# Patient Record
Sex: Female | Born: 1985 | Race: White | Hispanic: No | Marital: Single | Smoking: Never smoker
Health system: Southern US, Community
[De-identification: ages and names within clinical notes are randomized; demographics above are authoritative.]

## PROBLEM LIST (undated history)

## (undated) DIAGNOSIS — E0590 Thyrotoxicosis, unspecified without thyrotoxic crisis or storm: Secondary | ICD-10-CM

## (undated) DIAGNOSIS — R768 Other specified abnormal immunological findings in serum: Secondary | ICD-10-CM

## (undated) DIAGNOSIS — Z87898 Personal history of other specified conditions: Secondary | ICD-10-CM

## (undated) HISTORY — DX: Thyrotoxicosis, unspecified without thyrotoxic crisis or storm: E05.90

## (undated) HISTORY — DX: Other specified abnormal immunological findings in serum: R76.8

## (undated) HISTORY — DX: Personal history of other specified conditions: Z87.898

---

## 2004-03-16 DIAGNOSIS — Z87898 Personal history of other specified conditions: Secondary | ICD-10-CM

## 2004-03-16 HISTORY — PX: CERVICAL BIOPSY  W/ LOOP ELECTRODE EXCISION: SUR135

## 2004-03-16 HISTORY — PX: COLPOSCOPY: SHX161

## 2004-03-16 HISTORY — DX: Personal history of other specified conditions: Z87.898

## 2010-04-26 ENCOUNTER — Inpatient Hospital Stay (INDEPENDENT_AMBULATORY_CARE_PROVIDER_SITE_OTHER)
Admission: RE | Admit: 2010-04-26 | Discharge: 2010-04-26 | Disposition: A | Discharge status: Home / Self Care | Payer: Self-pay | Source: Ambulatory Visit

## 2010-04-26 DIAGNOSIS — R209 Unspecified disturbances of skin sensation: Secondary | ICD-10-CM

## 2010-04-26 DIAGNOSIS — M25539 Pain in unspecified wrist: Secondary | ICD-10-CM

## 2011-09-30 ENCOUNTER — Encounter (HOSPITAL_COMMUNITY): Payer: Self-pay | Admitting: Emergency Medicine

## 2011-09-30 ENCOUNTER — Emergency Department (HOSPITAL_COMMUNITY)
Admission: EM | Admit: 2011-09-30 | Discharge: 2011-10-01 | Disposition: A | Discharge status: Home / Self Care | Payer: No Typology Code available for payment source | Attending: Emergency Medicine | Admitting: Emergency Medicine

## 2011-09-30 DIAGNOSIS — S5010XA Contusion of unspecified forearm, initial encounter: Secondary | ICD-10-CM

## 2011-09-30 DIAGNOSIS — S39012A Strain of muscle, fascia and tendon of lower back, initial encounter: Secondary | ICD-10-CM

## 2011-09-30 DIAGNOSIS — S139XXA Sprain of joints and ligaments of unspecified parts of neck, initial encounter: Secondary | ICD-10-CM | POA: Insufficient documentation

## 2011-09-30 DIAGNOSIS — S335XXA Sprain of ligaments of lumbar spine, initial encounter: Secondary | ICD-10-CM | POA: Insufficient documentation

## 2011-09-30 DIAGNOSIS — S161XXA Strain of muscle, fascia and tendon at neck level, initial encounter: Secondary | ICD-10-CM

## 2011-09-30 DIAGNOSIS — S5000XA Contusion of unspecified elbow, initial encounter: Secondary | ICD-10-CM | POA: Insufficient documentation

## 2011-09-30 DIAGNOSIS — Y93I9 Activity, other involving external motion: Secondary | ICD-10-CM | POA: Insufficient documentation

## 2011-09-30 DIAGNOSIS — Y998 Other external cause status: Secondary | ICD-10-CM | POA: Insufficient documentation
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
## 2011-10-01 ENCOUNTER — Emergency Department (HOSPITAL_COMMUNITY): Payer: No Typology Code available for payment source

## 2011-10-01 MED ORDER — IBUPROFEN 800 MG PO TABS: 800.0000 mg | ORAL_TABLET | Freq: Three times a day (TID) | ORAL | Status: AC | PRN

## 2011-10-01 MED ORDER — HYDROCODONE-ACETAMINOPHEN 5-325 MG PO TABS: 1.0000 | ORAL_TABLET | Freq: Four times a day (QID) | ORAL | Status: AC | PRN
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
## 2011-10-01 NOTE — ED Provider Notes (Signed)
Medical screening examination/treatment/procedure(s) were performed by non-physician practitioner and as supervising physician I was immediately available for consultation/collaboration.  Olivia Mackie, MD 10/01/11 [phone]

## 2011-10-01 NOTE — ED Notes (Signed)
Patient transported to X-ray 

## 2011-10-01 NOTE — ED Notes (Signed)
Patient returned from X-ray 

## 2012-06-10 ENCOUNTER — Other Ambulatory Visit: Payer: Self-pay | Admitting: Internal Medicine

## 2012-06-10 ENCOUNTER — Ambulatory Visit
Admission: RE | Admit: 2012-06-10 | Discharge: 2012-06-10 | Disposition: A | Discharge status: Home / Self Care | Payer: No Typology Code available for payment source | Source: Ambulatory Visit | Attending: Internal Medicine | Admitting: Internal Medicine

## 2012-06-10 DIAGNOSIS — E049 Nontoxic goiter, unspecified: Secondary | ICD-10-CM

## 2012-06-21 ENCOUNTER — Other Ambulatory Visit: Payer: Self-pay

## 2012-12-16 ENCOUNTER — Ambulatory Visit (INDEPENDENT_AMBULATORY_CARE_PROVIDER_SITE_OTHER): Payer: No Typology Code available for payment source | Admitting: Certified Nurse Midwife

## 2012-12-16 ENCOUNTER — Encounter: Payer: Self-pay | Admitting: Certified Nurse Midwife

## 2012-12-16 VITALS — BP 90/60 | HR 72 | Ht 63.5 in | Wt 97.0 lb

## 2012-12-16 DIAGNOSIS — B3731 Acute candidiasis of vulva and vagina: Secondary | ICD-10-CM

## 2012-12-16 DIAGNOSIS — N760 Acute vaginitis: Secondary | ICD-10-CM

## 2012-12-16 DIAGNOSIS — B373 Candidiasis of vulva and vagina: Secondary | ICD-10-CM

## 2012-12-16 MED ORDER — METRONIDAZOLE 0.75 % VA GEL: 1.0000 | Freq: Every day | VAGINAL | Status: DC

## 2012-12-16 MED ORDER — FLUCONAZOLE 150 MG PO TABS: 150.0000 mg | ORAL_TABLET | Freq: Once | ORAL | Status: DC

## 2012-12-16 NOTE — Progress Notes (Signed)
27 y.o.SingleCaucasian female G0P0000 with a 2 week(s) history of the following: vaginal:burning, discharge described as malodorous and grey and dyspareunia Sexually active: yes Last sexual activity:7days ago. Pt also reports the following associated symptoms: none Patient has not tried over the counter treatment . No partner change since STD screen at [facility] which was negative. No STD concerns.  O: Healthy thin WD Affect: normal, orientation x 3   Exam:  [id number], Urethra, Skene's normal, no lesions noted                Vag:no lesions, discharge: copious, watery, malodorous and grey in color, pH 5.0, wet prep done                Cx:  normal appearance and non tender                Uterus:normal size, anteverted, non-tender, normal shape and consistency                Adnexa: normal adnexa and no mass, fullness, tenderness  Wet Prep shows:positive yeast, positive BV, negative trich   A::bacterial vaginosis Yeast Vaginitis   P:Reviewed findings of BV and yeast. Discussed baking soda sitz bath for comfort. :Symptomatic local care discussed. Oral antifungal see orders. Vaginal antibiotic see orders. Abstinence from intercourse discussed.  Rv prn

## 2012-12-20 NOTE — Progress Notes (Signed)
Note reviewed, agree with plan.  Jaques Mineer, MD  

## 2013-01-26 ENCOUNTER — Ambulatory Visit (INDEPENDENT_AMBULATORY_CARE_PROVIDER_SITE_OTHER): Payer: No Typology Code available for payment source | Admitting: Certified Nurse Midwife

## 2013-01-26 ENCOUNTER — Encounter: Payer: Self-pay | Admitting: Certified Nurse Midwife

## 2013-01-26 VITALS — BP 90/60 | Resp 12 | Ht 63.5 in | Wt 101.0 lb

## 2013-01-26 DIAGNOSIS — B373 Candidiasis of vulva and vagina: Secondary | ICD-10-CM

## 2013-01-26 DIAGNOSIS — B3731 Acute candidiasis of vulva and vagina: Secondary | ICD-10-CM

## 2013-01-26 MED ORDER — FLUCONAZOLE 150 MG PO TABS: 150.0000 mg | ORAL_TABLET | Freq: Once | ORAL | Status: DC

## 2013-01-26 MED ORDER — NYSTATIN-TRIAMCINOLONE 100000-0.1 UNIT/GM-% EX CREA: 1.0000 "application " | TOPICAL_CREAM | Freq: Two times a day (BID) | CUTANEOUS | Status: DC

## 2013-01-26 NOTE — Progress Notes (Signed)
Note reviewed, agree with plan.  Lafawn Lenoir, MD  

## 2013-01-26 NOTE — Progress Notes (Signed)
27 y.o.Single Caucasian female G0P0000 with a 7 day(s) history of the following:discharge described as clear, no odor Sexually active: yes Last sexual activity:3days ago. Pt also reports the following associated symptoms: pain with sexual activity,feels like tearing. Patient has not tried over the counter treatment.Patient did try OTC lubricant which made it worse. Patient treated for insect bite with antibiotics about 2 weeks ago, now feels symptoms started then. No other issues. Contraception: Nuvaring.  O: Healthy WDWN female Affect: normal, orientation x 3   Exam:  [id number]:[id number], Urethra, Skene's normal, increased pink on vulva and also white creamy exudate noted with some scaling at fourchette area down to perineum, no lesions  Wet prep taken                AOZ:HYQMVHQIO: copious and white, pH 4.0, wet prep done                Cx:  normal appearance and non tender                Uterus:normal size, non-tender, normal shape and consistency                Adnexa: normal adnexa and no mass, fullness, tenderness  Wet Prep shows:Yeast vaginitis and vulvuitis   A: Yeast vaginitis and vulvitis  P: Reviewed findings of yeast. Rx Diflucan see order Rx Mycolog cream see order :Symptomatic local care discussed. Aveeno sitz bath prn comfort. Discussed yeast can be passed to partner.  Rv prn

## 2013-04-10 ENCOUNTER — Other Ambulatory Visit: Payer: Self-pay | Admitting: Certified Nurse Midwife

## 2013-06-12 ENCOUNTER — Telehealth: Payer: Self-pay | Admitting: Certified Nurse Midwife

## 2013-06-12 ENCOUNTER — Encounter: Payer: Self-pay | Admitting: Certified Nurse Midwife

## 2013-06-12 ENCOUNTER — Ambulatory Visit (INDEPENDENT_AMBULATORY_CARE_PROVIDER_SITE_OTHER): Payer: No Typology Code available for payment source | Admitting: Certified Nurse Midwife

## 2013-06-12 VITALS — BP 92/60 | HR 64 | Resp 16 | Ht 63.5 in | Wt 101.0 lb

## 2013-06-12 DIAGNOSIS — B373 Candidiasis of vulva and vagina: Secondary | ICD-10-CM

## 2013-06-12 DIAGNOSIS — B3731 Acute candidiasis of vulva and vagina: Secondary | ICD-10-CM

## 2013-06-12 DIAGNOSIS — Z309 Encounter for contraceptive management, unspecified: Secondary | ICD-10-CM

## 2013-06-12 MED ORDER — ETONOGESTREL-ETHINYL ESTRADIOL 0.12-0.015 MG/24HR VA RING: 1.0000 | VAGINAL_RING | Freq: Once | VAGINAL | Status: DC

## 2013-06-12 NOTE — Telephone Encounter (Signed)
Spoke with patient. Patient states she has a yeast infection and would like to come in to see Verner Choleborah S. Leonard CNM. Patient requesting early morning appointment due to work schedule. Appointment made for today at 9:30. Patient agreeable to time and date.  Routing to provider for final review. Patient agreeable to disposition. Will close encounter

## 2013-06-12 NOTE — Progress Notes (Signed)
27 y.o.Single Caucasian female G0P0000 with complaint of vaginal discharge with odor for 3 days. Denies vaginal itching or burning. Describes discharge as white and mildly odorous. No new personal products, or new partner. Denies urinary symptoms or pain. Contraception is withdrawal. Patient ran out of Nuvaring (over due for [facility]). Desires Nuvaring continuance, she does not want pregnancy ever. No other health issues today.  O:Healthy WDWN female Affect: normal orientation x 3    Exam:  [id number]:[id number]:normal, Bartholin's, Urethra, Skene's normal                BJY:NWGNFAOZHVag:discharge: copious, white and odorless, pH 4.0, wet prep done                Cx:  normal appearance and non tender                Uterus:normal size, non-tender, normal shape and consistency                Adnexa: normal adnexa and no mass, fullness, tenderness  Wet Prep shows:positive for yeast   A:Yeast vaginitis Contraception withdrawal, desires Nuvaring again, [facility] due  P: Reviewed findings of yeast. Patient has allergy history with any "zole' product with intense localized reaction. Discussed unable to treat with Diflucan due to no contraceptive use. Patient will use OTC ph balance and daily probiotic and aveeno sitz bath to see if resolves. Patient will advise if no change and can treat once menses occurs.  Rx Nuvaring no refill, see order. Patient to schedule [facility]. Discussed concerns for pregnancy with no condom use and encouraged use.  Rv prn

## 2013-06-12 NOTE — Telephone Encounter (Signed)
Pt says she has a yeast infection. i didn't see any open slots today and pt can't come in the morning.

## 2013-06-12 NOTE — Patient Instructions (Signed)
Candida Infection, Adult A candida infection (also called yeast, fungus and Monilia infection) is an overgrowth of yeast that can occur anywhere on the body. A yeast infection commonly occurs in warm, moist body areas. Usually, the infection remains localized but can spread to become a systemic infection. A yeast infection may be a sign of a more severe disease such as diabetes, leukemia, or AIDS. A yeast infection can occur in both men and women. In women, Candida vaginitis is a vaginal infection. It is one of the most common causes of vaginitis. Men usually do not have symptoms or know they have an infection until other problems develop. Men may find out they have a yeast infection because their sex partner has a yeast infection. Uncircumcised men are more likely to get a yeast infection than circumcised men. This is because the uncircumcised glans is not exposed to air and does not remain as dry as that of a circumcised glans. Older adults may develop yeast infections around dentures. CAUSES  Women  Antibiotics.  Steroid medication taken for a long time.  Being overweight (obese).  Diabetes.  Poor immune condition.  Certain serious medical conditions.  Immune suppressive medications for organ transplant patients.  Chemotherapy.  Pregnancy.  Menstration.  Stress and fatigue.  Intravenous drug use.  Oral contraceptives.  Wearing tight-fitting clothes in the crotch area.  Catching it from a sex partner who has a yeast infection.  Spermicide.  Intravenous, urinary, or other catheters. Men  Catching it from a sex partner who has a yeast infection.  Having oral or anal sex with a person who has the infection.  Spermicide.  Diabetes.  Antibiotics.  Poor immune system.  Medications that suppress the immune system.  Intravenous drug use.  Intravenous, urinary, or other catheters. SYMPTOMS  Women  Thick, white vaginal discharge.  Vaginal itching.  Redness and  swelling in and around the vagina.  Irritation of the lips of the vagina and perineum.  Blisters on the vaginal lips and perineum.  Painful sexual intercourse.  Low blood sugar (hypoglycemia).  Painful urination.  Bladder infections.  Intestinal problems such as constipation, indigestion, bad breath, bloating, increase in gas, diarrhea, or loose stools. Men  Men may develop intestinal problems such as constipation, indigestion, bad breath, bloating, increase in gas, diarrhea, or loose stools.  Dry, cracked skin on the penis with itching or discomfort.  Jock itch.  Dry, flaky skin.  Athlete's foot.  Hypoglycemia. DIAGNOSIS  Women  A history and an exam are performed.  The discharge may be examined under a microscope.  A culture may be taken of the discharge. Men  A history and an exam are performed.  Any discharge from the penis or areas of cracked skin will be looked at under the microscope and cultured.  Stool samples may be cultured. TREATMENT  Women  Vaginal antifungal suppositories and creams.  Medicated creams to decrease irritation and itching on the outside of the vagina.  Warm compresses to the perineal area to decrease swelling and discomfort.  Oral antifungal medications.  Medicated vaginal suppositories or cream for repeated or recurrent infections.  Wash and dry the irritation areas before applying the cream.  Eating yogurt with lactobacillus may help with prevention and treatment.  Sometimes painting the vagina with gentian violet solution may help if creams and suppositories do not work. Men  Antifungal creams and oral antifungal medications.  Sometimes treatment must continue for 30 days after the symptoms go away to prevent recurrence. HOME CARE   INSTRUCTIONS  Women  Use cotton underwear and avoid tight-fitting clothing.  Avoid colored, scented toilet paper and deodorant tampons or pads.  Do not douche.  Keep your diabetes  under control.  Finish all the prescribed medications.  Keep your skin clean and dry.  Consume milk or yogurt with lactobacillus active culture regularly. If you get frequent yeast infections and think that is what the infection is, there are over-the-counter medications that you can get. If the infection does not show healing in 3 days, talk to your caregiver.  Tell your sex partner you have a yeast infection. Your partner may need treatment also, especially if your infection does not clear up or recurs. Men  Keep your skin clean and dry.  Keep your diabetes under control.  Finish all prescribed medications.  Tell your sex partner that you have a yeast infection so they can be treated if necessary. SEEK MEDICAL CARE IF:   Your symptoms do not clear up or worsen in one week after treatment.  You have an oral temperature above 102 F (38.9 C).  You have trouble swallowing or eating for a prolonged time.  You develop blisters on and around your vagina.  You develop vaginal bleeding and it is not your menstrual period.  You develop abdominal pain.  You develop intestinal problems as mentioned above.  You get weak or lightheaded.  You have painful or increased urination.  You have pain during sexual intercourse. MAKE SURE YOU:   Understand these instructions.  Will watch your condition.  Will get help right away if you are not doing well or get worse. Document Released: 04/09/2004 Document Revised: 05/25/2011 Document Reviewed: 07/22/2009 [employer] Patient Information 2014 [employer].  

## 2013-06-13 NOTE — Progress Notes (Signed)
Reviewed personally.  M. Suzanne Achol Azpeitia, MD.  

## 2013-06-29 ENCOUNTER — Encounter: Payer: Self-pay | Admitting: Certified Nurse Midwife

## 2013-06-29 ENCOUNTER — Ambulatory Visit (INDEPENDENT_AMBULATORY_CARE_PROVIDER_SITE_OTHER): Payer: No Typology Code available for payment source | Admitting: Certified Nurse Midwife

## 2013-06-29 VITALS — BP 90/60 | Resp 18 | Ht 63.0 in | Wt 102.0 lb

## 2013-06-29 DIAGNOSIS — Z01419 Encounter for gynecological examination (general) (routine) without abnormal findings: Secondary | ICD-10-CM

## 2013-06-29 DIAGNOSIS — Z0000 Encounter for general adult medical examination without abnormal findings: Secondary | ICD-10-CM

## 2013-06-29 DIAGNOSIS — N898 Other specified noninflammatory disorders of vagina: Secondary | ICD-10-CM

## 2013-06-29 DIAGNOSIS — Z309 Encounter for contraceptive management, unspecified: Secondary | ICD-10-CM

## 2013-06-29 MED ORDER — ETONOGESTREL-ETHINYL ESTRADIOL 0.12-0.015 MG/24HR VA RING: 1.0000 | VAGINAL_RING | Freq: Once | VAGINAL | Status: DC

## 2013-06-29 NOTE — Progress Notes (Signed)
28 y.o. G0P0000 Single Caucasian Fe here for annual exam.  Periods normal, no issues. Contraception working well. Still complaining of increase in vaginal discharge with odor. Now has Nuvaring in so can be treated if needed.Previous no contraception and had not had period. Medication change with Saul FordyceKelly Virgil, PA with discontinuation of Abilify. Patient was also diagnosed last year with acute Hepatitis and hypothyroid which has all resolved per blood work. Partner change, desires STD screening.  No other health issues today. Sees PCP as needed.  Patient's last menstrual period was 06/24/2013.          Sexually active: yes  The current method of family planning is NuvaRing vaginal inserts.    Exercising: no  The patient does not participate in regular exercise at present. Smoker:  no  Health Maintenance: Pap:  04/06/12 Neg TDaP:  2013 Labs:  Declines urine     reports that she has never smoked. She has never used smokeless tobacco. She reports that she drinks alcohol. She reports that she does not use illicit drugs.  Past Medical History  Diagnosis Date  . History of abnormal Pap smear 2006    with LEEP  . Hyperthyroidism     Past Surgical History  Procedure Laterality Date  . Cervical biopsy  w/ loop electrode excision  2006  . Colposcopy  2006    Current Outpatient Prescriptions  Medication Sig Dispense Refill  . amphetamine-dextroamphetamine (ADDERALL XR) 15 MG 24 hr capsule Take 1 capsule by mouth daily.      Marland Kitchen. etonogestrel-ethinyl estradiol (NUVARING) 0.12-0.015 MG/24HR vaginal ring Place 1 each vaginally once. Insert vaginally and leave in place for 3 consecutive weeks, then remove for 1 week.  1 each  0  . gabapentin (NEURONTIN) 300 MG capsule Take 300 mg by mouth daily.      Marland Kitchen. buPROPion (WELLBUTRIN XL) 150 MG 24 hr tablet        No current facility-administered medications for this visit.    History reviewed. No pertinent family history.  ROS:  Pertinent items are noted  in HPI.  Otherwise, a comprehensive ROS was negative.  Exam:   BP 90/60  Resp 18  Ht 5\' 3"  (1.6 m)  Wt 102 lb (46.267 kg)  BMI 18.07 kg/m2  LMP 06/24/2013 Height: 5\' 3"  (160 cm)  Ht Readings from Last 3 Encounters:  06/29/13 5\' 3"  (1.6 m)  06/12/13 5' 3.5" (1.613 m)  01/26/13 5' 3.5" (1.613 m)    General appearance: alert, cooperative and appears stated age Head: Normocephalic, without obvious abnormality, atraumatic Neck: no adenopathy, supple, symmetrical, trachea midline and thyroid normal to inspection and palpation and non-palpable Lungs: clear to auscultation bilaterally Breasts: normal appearance, no masses or tenderness, No nipple retraction or dimpling, No nipple discharge or bleeding, No axillary or supraclavicular adenopathy Heart: regular rate and rhythm Abdomen: soft, non-tender; no masses,  no organomegaly Extremities: extremities normal, atraumatic, no cyanosis or edema Skin: Skin color, texture, turgor normal. No rashes or lesions Lymph nodes: Cervical, supraclavicular, and axillary nodes normal. No abnormal inguinal nodes palpated Neurologic: Grossly normal   Pelvic: External genitalia:  no lesions              Urethra:  normal appearing urethra with no masses, tenderness or lesions              Bartholin's and Skene's: normal                 Vagina: normal appearing vagina with normal color  and discharge, no lesions wet prep taken, ph 3.5              Cervix: normal, non tender              Pap taken: no Bimanual Exam:  Uterus:  normal size, contour, position, consistency, mobility, non-tender and anteverted              Adnexa: normal adnexa and no mass, fullness, tenderness               Rectovaginal: Confirms               Anus:  Deferred  Wet prep: no pathogens  A:  Well Woman with normal exam  Contraception Nuvaring desired  STD screening  P:   Reviewed health and wellness pertinent to exam  Rx Nuvaring see order  Lab: GC,Chlamydia,  HIV,RPR  Discussed negative wet prep and normal vaginal discharge at this point. Discussed increase discharge when puts new Nuvaring in. Patient remembers from before. Reassured.  Pap smear as per guidelines   pap smear not taken today  counseled on breast self exam, STD prevention, HIV risk factors and prevention, adequate intake of calcium and vitamin D, diet and exercise  return annually or prn  An After Visit Summary was printed and given to the patient.

## 2013-06-29 NOTE — Patient Instructions (Signed)
General topics  Next pap or exam is  due in 1 year Take a Women's multivitamin Take 1200 mg. of calcium daily - prefer dietary If any concerns in interim to call back  Breast Self-Awareness Practicing breast self-awareness may pick up problems early, prevent significant medical complications, and possibly save your life. By practicing breast self-awareness, you can become familiar with how your breasts look and feel and if your breasts are changing. This allows you to notice changes early. It can also offer you some reassurance that your breast health is good. One way to learn what is normal for your breasts and whether your breasts are changing is to do a breast self-exam. If you find a lump or something that was not present in the past, it is best to contact your caregiver right away. Other findings that should be evaluated by your caregiver include nipple discharge, especially if it is bloody; skin changes or reddening; areas where the skin seems to be pulled in (retracted); or new lumps and bumps. Breast pain is seldom associated with cancer (malignancy), but should also be evaluated by a caregiver. BREAST SELF-EXAM The best time to examine your breasts is 5 7 days after your menstrual period is over.  ExitCare Patient Information 2013 [employer].   Exercise to Stay Healthy Exercise helps you become and stay healthy. EXERCISE IDEAS AND TIPS Choose exercises that:  You enjoy.  Fit into your day. You do not need to exercise really hard to be healthy. You can do exercises at a slow or medium level and stay healthy. You can:  Stretch before and after working out.  Try yoga, Pilates, or tai chi.  Lift weights.  Walk fast, swim, jog, run, climb stairs, bicycle, dance, or rollerskate.  Take aerobic classes. Exercises that burn about 150 calories:  Running 1  miles in 15 minutes.  Playing volleyball for 45 to 60 minutes.  Washing and waxing a car for 45 to 60  minutes.  Playing touch football for 45 minutes.  Walking 1  miles in 35 minutes.  Pushing a stroller 1  miles in 30 minutes.  Playing basketball for 30 minutes.  Raking leaves for 30 minutes.  Bicycling 5 miles in 30 minutes.  Walking 2 miles in 30 minutes.  Dancing for 30 minutes.  Shoveling snow for 15 minutes.  Swimming laps for 20 minutes.  Walking up stairs for 15 minutes.  Bicycling 4 miles in 15 minutes.  Gardening for 30 to 45 minutes.  Jumping rope for 15 minutes.  Washing windows or floors for 45 to 60 minutes. Document Released: 04/04/2010 Document Revised: 05/25/2011 Document Reviewed: 04/04/2010 ExitCare Patient Information 2013 [employer].   Other topics ( that may be useful information):    Sexually Transmitted Disease Sexually transmitted disease (STD) refers to any infection that is passed from person to person during sexual activity. This may happen by way of saliva, semen, blood, vaginal mucus, or urine. Common STDs include:  Gonorrhea.  Chlamydia.  Syphilis.  HIV/AIDS.  Genital herpes.  Hepatitis B and C.  Trichomonas.  Human papillomavirus (HPV).  Pubic lice. CAUSES  An STD may be spread by bacteria, virus, or parasite. A person can get an STD by:  Sexual intercourse with an infected person.  Sharing sex toys with an infected person.  Sharing needles with an infected person.  Having intimate contact with the genitals, mouth, or rectal areas of an infected person. SYMPTOMS  Some people may not have any symptoms, but   they can still pass the infection to others. Different STDs have different symptoms. Symptoms include:  Painful or bloody urination.  Pain in the pelvis, abdomen, vagina, anus, throat, or eyes.  Skin rash, itching, irritation, growths, or sores (lesions). These usually occur in the genital or anal area.  Abnormal vaginal discharge.  Penile discharge in men.  Soft, flesh-colored skin growths in the  genital or anal area.  Fever.  Pain or bleeding during sexual intercourse.  Swollen glands in the groin area.  Yellow skin and eyes (jaundice). This is seen with hepatitis. DIAGNOSIS  To make a diagnosis, your caregiver may:  Take a medical history.  Perform a physical exam.  Take a specimen (culture) to be examined.  Examine a sample of discharge under a microscope.  Perform blood test TREATMENT   Chlamydia, gonorrhea, trichomonas, and syphilis can be cured with antibiotic medicine.  Genital herpes, hepatitis, and HIV can be treated, but not cured, with prescribed medicines. The medicines will lessen the symptoms.  Genital warts from HPV can be treated with medicine or by freezing, burning (electrocautery), or surgery. Warts may come back.  HPV is a virus and cannot be cured with medicine or surgery.However, abnormal areas may be followed very closely by your caregiver and may be removed from the cervix, vagina, or vulva through office procedures or surgery. If your diagnosis is confirmed, your recent sexual partners need treatment. This is true even if they are symptom-free or have a negative culture or evaluation. They should not have sex until their caregiver says it is okay. HOME CARE INSTRUCTIONS  All sexual partners should be informed, tested, and treated for all STDs.  Take your antibiotics as directed. Finish them even if you start to feel better.  Only take over-the-counter or prescription medicines for pain, discomfort, or fever as directed by your caregiver.  Rest.  Eat a balanced diet and drink enough fluids to keep your urine clear or pale yellow.  Do not have sex until treatment is completed and you have followed up with your caregiver. STDs should be checked after treatment.  Keep all follow-up appointments, Pap tests, and blood tests as directed by your caregiver.  Only use latex condoms and water-soluble lubricants during sexual activity. Do not use  petroleum jelly or oils.  Avoid alcohol and illegal drugs.  Get vaccinated for HPV and hepatitis. If you have not received these vaccines in the past, talk to your caregiver about whether one or both might be right for you.  Avoid risky sex practices that can break the skin. The only way to avoid getting an STD is to avoid all sexual activity.Latex condoms and dental dams (for oral sex) will help lessen the risk of getting an STD, but will not completely eliminate the risk. SEEK MEDICAL CARE IF:   You have a fever.  You have any new or worsening symptoms. Document Released: 05/23/2002 Document Revised: 05/25/2011 Document Reviewed: 05/30/2010 [facility] Patient Information 2013 [employer].    Domestic Abuse You are being battered or abused if someone close to you hits, pushes, or physically hurts you in any way. You also are being abused if you are forced into activities. You are being sexually abused if you are forced to have sexual contact of any kind. You are being emotionally abused if you are made to feel worthless or if you are constantly threatened. It is important to remember that help is available. No one has the right to abuse you. PREVENTION OF FURTHER  ABUSE  Learn the warning signs of danger. This varies with situations but may include: the use of alcohol, threats, isolation from friends and family, or forced sexual contact. Leave if you feel that violence is going to occur.  If you are attacked or beaten, report it to the police so the abuse is documented. You do not have to press charges. The police can protect you while you or the attackers are leaving. Get the officer's name and badge number and a copy of the report.  Find someone you can trust and tell them what is happening to you: your caregiver, a nurse, clergy member, close friend or family member. Feeling ashamed is natural, but remember that you have done nothing wrong. No one deserves abuse. Document Released:  02/28/2000 Document Revised: 05/25/2011 Document Reviewed: 05/08/2010 ExitCare Patient Information 2013 [employer].    How Much is Too Much Alcohol? Drinking too much alcohol can cause injury, accidents, and health problems. These types of problems can include:   Car crashes.  Falls.  Family fighting (domestic violence).  Drowning.  Fights.  Injuries.  Burns.  Damage to certain organs.  Having a baby with birth defects. ONE DRINK CAN BE TOO MUCH WHEN YOU ARE:  Working.  Pregnant or breastfeeding.  Taking medicines. Ask your doctor.  Driving or planning to drive. If you or someone you know has a drinking problem, get help from a doctor.  Document Released: 12/27/2008 Document Revised: 05/25/2011 Document Reviewed: 12/27/2008 ExitCare Patient Information 2013 [employer].   Smoking Hazards Smoking cigarettes is extremely bad for your health. Tobacco smoke has over 200 known poisons in it. There are over 60 chemicals in tobacco smoke that cause cancer. Some of the chemicals found in cigarette smoke include:   Cyanide.  Benzene.  Formaldehyde.  Methanol (wood alcohol).  Acetylene (fuel used in welding torches).  Ammonia. Cigarette smoke also contains the poisonous gases nitrogen oxide and carbon monoxide.  Cigarette smokers have an increased risk of many serious medical problems and Smoking causes approximately:  90% of all lung cancer deaths in men.  80% of all lung cancer deaths in women.  90% of deaths from chronic obstructive lung disease. Compared with nonsmokers, smoking increases the risk of:  Coronary heart disease by 2 to 4 times.  Stroke by 2 to 4 times.  Men developing lung cancer by 23 times.  Women developing lung cancer by 13 times.  Dying from chronic obstructive lung diseases by 12 times.  . Smoking is the most preventable cause of death and disease in our society.  WHY IS SMOKING ADDICTIVE?  Nicotine is the chemical  agent in tobacco that is capable of causing addiction or dependence.  When you smoke and inhale, nicotine is absorbed rapidly into the bloodstream through your lungs. Nicotine absorbed through the lungs is capable of creating a powerful addiction. Both inhaled and non-inhaled nicotine may be addictive.  Addiction studies of cigarettes and spit tobacco show that addiction to nicotine occurs mainly during the teen years, when young people begin using tobacco products. WHAT ARE THE BENEFITS OF QUITTING?  There are many health benefits to quitting smoking.   Likelihood of developing cancer and heart disease decreases. Health improvements are seen almost immediately.  Blood pressure, pulse rate, and breathing patterns start returning to normal soon after quitting. QUITTING SMOKING   American Lung Association - 1-800-LUNGUSA  American Cancer Society - 1-800-ACS-2345 Document Released: 04/09/2004 Document Revised: 05/25/2011 Document Reviewed: 12/12/2008 ExitCare Patient Information 2013 [employer]   LL[employer].   Stress Management Stress is a state of physical or mental tension that often results from changes in your life or normal routine. Some common causes of stress are:  Death of a loved one.  Injuries or severe illnesses.  Getting fired or changing jobs.  Moving into a new home. Other causes may be:  Sexual problems.  Business or financial losses.  Taking on a large debt.  Regular conflict with someone at home or at work.  Constant tiredness from lack of sleep. It is not just bad things that are stressful. It may be stressful to:  Win the lottery.  Get married.  Buy a new car. The amount of stress that can be easily tolerated varies from person to person. Changes generally cause stress, regardless of the types of change. Too much stress can affect your health. It may lead to physical or emotional problems. Too little stress (boredom) may also become stressful. SUGGESTIONS TO  REDUCE STRESS:  Talk things over with your family and friends. It often is helpful to share your concerns and worries. If you feel your problem is serious, you may want to get help from a professional counselor.  Consider your problems one at a time instead of lumping them all together. Trying to take care of everything at once may seem impossible. List all the things you need to do and then start with the most important one. Set a goal to accomplish 2 or 3 things each day. If you expect to do too many in a single day you will naturally fail, causing you to feel even more stressed.  Do not use alcohol or drugs to relieve stress. Although you may feel better for a short time, they do not remove the problems that caused the stress. They can also be habit forming.  Exercise regularly - at least 3 times per week. Physical exercise can help to relieve that "uptight" feeling and will relax you.  The shortest distance between despair and hope is often a good night's sleep.  Go to bed and get up on time allowing yourself time for appointments without being rushed.  Take a short "time-out" period from any stressful situation that occurs during the day. Close your eyes and take some deep breaths. Starting with the muscles in your face, tense them, hold it for a few seconds, then relax. Repeat this with the muscles in your neck, shoulders, hand, stomach, back and legs.  Take good care of yourself. Eat a balanced diet and get plenty of rest.  Schedule time for having fun. Take a break from your daily routine to relax. HOME CARE INSTRUCTIONS   Call if you feel overwhelmed by your problems and feel you can no longer manage them on your own.  Return immediately if you feel like hurting yourself or someone else. Document Released: 08/26/2000 Document Revised: 05/25/2011 Document Reviewed: 04/18/2007 ExitCare Patient Information 2013 [employer].   

## 2013-06-30 LAB — CBC
HCT: 35.8 % — ABNORMAL LOW (ref 36.0–46.0)
Hemoglobin: 12.1 g/dL (ref 12.0–15.0)
MCH: 29.3 pg (ref 26.0–34.0)
MCHC: 33.8 g/dL (ref 30.0–36.0)
MCV: 86.7 fL (ref 78.0–100.0)
PLATELETS: 284 10*3/uL (ref 150–400)
RBC: 4.13 MIL/uL (ref 3.87–5.11)
RDW: 13.2 % (ref 11.5–15.5)
WBC: 4.3 10*3/uL (ref 4.0–10.5)

## 2013-06-30 LAB — HIV ANTIBODY (ROUTINE TESTING W REFLEX): HIV 1&2 Ab, 4th Generation: NONREACTIVE

## 2013-06-30 LAB — RPR

## 2013-07-04 LAB — IPS N GONORRHOEA AND CHLAMYDIA BY PCR

## 2013-07-04 LAB — IPS PAP TEST WITH REFLEX TO HPV

## 2013-07-06 ENCOUNTER — Telehealth: Payer: Self-pay | Admitting: Nurse Practitioner

## 2013-07-06 NOTE — Telephone Encounter (Signed)
Patient canceled her appointment for "yeast inf" 07/07/13. Patient she is feeling much better and doesn't need to keep this appointment.

## 2013-07-07 ENCOUNTER — Ambulatory Visit: Payer: No Typology Code available for payment source | Admitting: Nurse Practitioner

## 2013-07-07 NOTE — Progress Notes (Signed)
Reviewed personally.  M. Suzanne Arlenne Kimbley, MD.  

## 2013-10-02 ENCOUNTER — Encounter (HOSPITAL_COMMUNITY): Payer: Self-pay | Admitting: Emergency Medicine

## 2013-10-02 ENCOUNTER — Emergency Department (HOSPITAL_COMMUNITY): Payer: No Typology Code available for payment source

## 2013-10-02 ENCOUNTER — Emergency Department (HOSPITAL_COMMUNITY)
Admission: EM | Admit: 2013-10-02 | Discharge: 2013-10-02 | Disposition: A | Discharge status: Home / Self Care | Payer: No Typology Code available for payment source | Attending: Emergency Medicine | Admitting: Emergency Medicine

## 2013-10-02 DIAGNOSIS — M545 Low back pain, unspecified: Secondary | ICD-10-CM | POA: Insufficient documentation

## 2013-10-02 DIAGNOSIS — Z008 Encounter for other general examination: Secondary | ICD-10-CM | POA: Diagnosis present

## 2013-10-02 DIAGNOSIS — F911 Conduct disorder, childhood-onset type: Secondary | ICD-10-CM | POA: Insufficient documentation

## 2013-10-02 DIAGNOSIS — Z3202 Encounter for pregnancy test, result negative: Secondary | ICD-10-CM | POA: Insufficient documentation

## 2013-10-02 DIAGNOSIS — F329 Major depressive disorder, single episode, unspecified: Secondary | ICD-10-CM | POA: Diagnosis present

## 2013-10-02 DIAGNOSIS — Z79899 Other long term (current) drug therapy: Secondary | ICD-10-CM | POA: Diagnosis not present

## 2013-10-02 DIAGNOSIS — Z862 Personal history of diseases of the blood and blood-forming organs and certain disorders involving the immune mechanism: Secondary | ICD-10-CM | POA: Insufficient documentation

## 2013-10-02 DIAGNOSIS — Z8639 Personal history of other endocrine, nutritional and metabolic disease: Secondary | ICD-10-CM | POA: Insufficient documentation

## 2013-10-02 DIAGNOSIS — F32A Depression, unspecified: Secondary | ICD-10-CM | POA: Diagnosis present

## 2013-10-02 DIAGNOSIS — F3289 Other specified depressive episodes: Secondary | ICD-10-CM

## 2013-10-02 DIAGNOSIS — M25569 Pain in unspecified knee: Secondary | ICD-10-CM | POA: Diagnosis not present

## 2013-10-02 DIAGNOSIS — R454 Irritability and anger: Secondary | ICD-10-CM

## 2013-10-02 DIAGNOSIS — F39 Unspecified mood [affective] disorder: Secondary | ICD-10-CM

## 2013-10-02 DIAGNOSIS — F3481 Disruptive mood dysregulation disorder: Secondary | ICD-10-CM | POA: Diagnosis present

## 2013-10-02 LAB — BASIC METABOLIC PANEL
Anion gap: 11 (ref 5–15)
BUN: 9 mg/dL (ref 6–23)
CO2: 25 mEq/L (ref 19–32)
Calcium: 8.8 mg/dL (ref 8.4–10.5)
Chloride: 105 mEq/L (ref 96–112)
Creatinine, Ser: 0.65 mg/dL (ref 0.50–1.10)
Glucose, Bld: 107 mg/dL — ABNORMAL HIGH (ref 70–99)
POTASSIUM: 3.6 meq/L — AB (ref 3.7–5.3)
SODIUM: 141 meq/L (ref 137–147)

## 2013-10-02 LAB — CBC WITH DIFFERENTIAL/PLATELET
BASOS PCT: 1 % (ref 0–1)
Basophils Absolute: 0 10*3/uL (ref 0.0–0.1)
EOS ABS: 0 10*3/uL (ref 0.0–0.7)
Eosinophils Relative: 1 % (ref 0–5)
HCT: 34.9 % — ABNORMAL LOW (ref 36.0–46.0)
Hemoglobin: 11.8 g/dL — ABNORMAL LOW (ref 12.0–15.0)
Lymphocytes Relative: 49 % — ABNORMAL HIGH (ref 12–46)
Lymphs Abs: 2.1 10*3/uL (ref 0.7–4.0)
MCH: 30.3 pg (ref 26.0–34.0)
MCHC: 33.8 g/dL (ref 30.0–36.0)
MCV: 89.5 fL (ref 78.0–100.0)
MONOS PCT: 9 % (ref 3–12)
Monocytes Absolute: 0.4 10*3/uL (ref 0.1–1.0)
NEUTROS PCT: 40 % — AB (ref 43–77)
Neutro Abs: 1.7 10*3/uL (ref 1.7–7.7)
PLATELETS: 270 10*3/uL (ref 150–400)
RBC: 3.9 MIL/uL (ref 3.87–5.11)
RDW: 12.5 % (ref 11.5–15.5)
WBC: 4.3 10*3/uL (ref 4.0–10.5)

## 2013-10-02 LAB — RAPID URINE DRUG SCREEN, HOSP PERFORMED
Amphetamines: NOT DETECTED
BARBITURATES: NOT DETECTED
Benzodiazepines: NOT DETECTED
COCAINE: NOT DETECTED
Opiates: NOT DETECTED
Tetrahydrocannabinol: NOT DETECTED

## 2013-10-02 LAB — ETHANOL: Alcohol, Ethyl (B): <11 mg/dL (ref 0–11)

## 2013-10-02 LAB — POC URINE PREG, ED: Preg Test, Ur: NEGATIVE
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
## 2013-10-02 NOTE — ED Notes (Signed)
Patient has a black dress ,black flip flops, and underwear and jewelry.  Gave cell phone and wallet to her mother.

## 2013-10-08 NOTE — Consult Note (Signed)
Case discussed, agree with plan 

## 2013-10-11 ENCOUNTER — Ambulatory Visit (INDEPENDENT_AMBULATORY_CARE_PROVIDER_SITE_OTHER): Payer: No Typology Code available for payment source | Admitting: Certified Nurse Midwife

## 2013-10-11 ENCOUNTER — Encounter: Payer: Self-pay | Admitting: Certified Nurse Midwife

## 2013-10-11 VITALS — BP 96/62 | HR 60 | Resp 16 | Ht 63.0 in | Wt 94.0 lb

## 2013-10-11 DIAGNOSIS — N760 Acute vaginitis: Secondary | ICD-10-CM

## 2013-10-11 DIAGNOSIS — Z202 Contact with and (suspected) exposure to infections with a predominantly sexual mode of transmission: Secondary | ICD-10-CM

## 2013-10-11 MED ORDER — METRONIDAZOLE 500 MG PO TABS: 500.0000 mg | ORAL_TABLET | Freq: Two times a day (BID) | ORAL | Status: DC

## 2013-10-11 NOTE — Progress Notes (Signed)
27 y.o.single white female g0p0 here with complaint of vaginal symptoms of itching, burning, and increase discharge. Describes discharge as watery pink with odor. Onset of symptoms 7 days ago. Denies new personal products.  STD concerns and would like testing. Partner of 3 years broke trust and concerned about possible exposure.  Urinary symptoms none .   O:Healthy female [id number] Affect: normal, orientation x 3  Exam: Abdomen:non tender Lymph node: no enlargement or tenderness Pelvic exam: External genital: normal, no lesions BUS: negative Vagina: watery yellow grey odorous discharge noted. Ph:5.5   ,Wet prep taken Nuvaring in vagina Cervix: normal, non tender Uterus: normal, non tender Adnexa:normal, non tender, no masses or fullness noted  Wet Prep results: positive for clue  A:BV STD screening   P:Discussed findings of BV and etiology. Discussed Aveeno or baking soda sitz bath for comfort. Avoid moist clothes or pads for extended period of time. If working out in gym clothes or swim suits for long periods of time change underwear or bottoms of swimsuit if possible. Rx: Flagyl see order Discussed consistent condom use for protection and re screen in 3 months and do HIV,RPR, Hep C also. Lab GC, Chlamydia  Rv prn

## 2013-10-11 NOTE — Patient Instructions (Signed)
Bacterial Vaginosis Bacterial vaginosis is a vaginal infection that occurs when the normal balance of bacteria in the vagina is disrupted. It results from an overgrowth of certain bacteria. This is the most common vaginal infection in women of childbearing age. Treatment is important to prevent complications, especially in pregnant women, as it can cause a premature delivery. CAUSES  Bacterial vaginosis is caused by an increase in harmful bacteria that are normally present in smaller amounts in the vagina. Several different kinds of bacteria can cause bacterial vaginosis. However, the reason that the condition develops is not fully understood. RISK FACTORS Certain activities or behaviors can put you at an increased risk of developing bacterial vaginosis, including:  Having a new sex partner or multiple sex partners.  Douching.  Using an intrauterine device (IUD) for contraception. Women do not get bacterial vaginosis from toilet seats, bedding, swimming pools, or contact with objects around them. SIGNS AND SYMPTOMS  Some women with bacterial vaginosis have no signs or symptoms. Common symptoms include:  Grey vaginal discharge.  A fishlike odor with discharge, especially after sexual intercourse.  Itching or burning of the vagina and vulva.  Burning or pain with urination. DIAGNOSIS  Your health care provider will take a medical history and examine the vagina for signs of bacterial vaginosis. A sample of vaginal fluid may be taken. Your health care provider will look at this sample under a microscope to check for bacteria and abnormal cells. A vaginal pH test may also be done.  TREATMENT  Bacterial vaginosis may be treated with antibiotic medicines. These may be given in the form of a pill or a vaginal cream. A second round of antibiotics may be prescribed if the condition comes back after treatment.  HOME CARE INSTRUCTIONS   Only take over-the-counter or prescription medicines as  directed by your health care provider.  If antibiotic medicine was prescribed, take it as directed. Make sure you finish it even if you start to feel better.  Do not have sex until treatment is completed.  Tell all sexual partners that you have a vaginal infection. They should see their health care provider and be treated if they have problems, such as a mild rash or itching.  Practice safe sex by using condoms and only having one sex partner. SEEK MEDICAL CARE IF:   Your symptoms are not improving after 3 days of treatment.  You have increased discharge or pain.  You have a fever. MAKE SURE YOU:   Understand these instructions.  Will watch your condition.  Will get help right away if you are not doing well or get worse. FOR MORE INFORMATION  Centers for Disease Control and Prevention, Division of STD Prevention: www.cdc.gov/std American Sexual Health Association (ASHA): www.ashastd.org  Document Released: 03/02/2005 Document Revised: 12/21/2012 Document Reviewed: 10/12/2012 ExitCare Patient Information 2015 [employer]. This information is not intended to replace advice given to you by your health care provider. Make sure you discuss any questions you have with your health care provider.  

## 2013-10-12 NOTE — Progress Notes (Signed)
Reviewed personally.  M. Suzanne Meya Clutter, MD.  

## 2013-10-13 LAB — IPS N GONORRHOEA AND CHLAMYDIA BY PCR

## 2013-10-13 NOTE — ED Provider Notes (Signed)
Medical screening examination/treatment/procedure(s) were conducted as a shared visit with non-physician practitioner(s) and myself.  I personally evaluated the patient during the encounter.   EKG Interpretation None       Doug SouSam Olie Dibert, MD 10/13/13 1113

## 2013-12-05 ENCOUNTER — Ambulatory Visit (INDEPENDENT_AMBULATORY_CARE_PROVIDER_SITE_OTHER): Payer: No Typology Code available for payment source | Admitting: Certified Nurse Midwife

## 2013-12-05 ENCOUNTER — Ambulatory Visit: Payer: No Typology Code available for payment source | Admitting: Certified Nurse Midwife

## 2013-12-05 ENCOUNTER — Encounter: Payer: Self-pay | Admitting: Certified Nurse Midwife

## 2013-12-05 VITALS — BP 110/70 | HR 68 | Resp 16 | Ht 63.0 in | Wt 95.0 lb

## 2013-12-05 DIAGNOSIS — Z202 Contact with and (suspected) exposure to infections with a predominantly sexual mode of transmission: Secondary | ICD-10-CM

## 2013-12-05 DIAGNOSIS — N760 Acute vaginitis: Secondary | ICD-10-CM

## 2013-12-05 MED ORDER — METRONIDAZOLE 500 MG PO TABS: 500.0000 mg | ORAL_TABLET | Freq: Two times a day (BID) | ORAL | Status: DC

## 2013-12-05 NOTE — Patient Instructions (Signed)
Bacterial Vaginosis Bacterial vaginosis is a vaginal infection that occurs when the normal balance of bacteria in the vagina is disrupted. It results from an overgrowth of certain bacteria. This is the most common vaginal infection in women of childbearing age. Treatment is important to prevent complications, especially in pregnant women, as it can cause a premature delivery. CAUSES  Bacterial vaginosis is caused by an increase in harmful bacteria that are normally present in smaller amounts in the vagina. Several different kinds of bacteria can cause bacterial vaginosis. However, the reason that the condition develops is not fully understood. RISK FACTORS Certain activities or behaviors can put you at an increased risk of developing bacterial vaginosis, including:  Having a new sex partner or multiple sex partners.  Douching.  Using an intrauterine device (IUD) for contraception. Women do not get bacterial vaginosis from toilet seats, bedding, swimming pools, or contact with objects around them. SIGNS AND SYMPTOMS  Some women with bacterial vaginosis have no signs or symptoms. Common symptoms include:  Grey vaginal discharge.  A fishlike odor with discharge, especially after sexual intercourse.  Itching or burning of the vagina and vulva.  Burning or pain with urination. DIAGNOSIS  Your health care provider will take a medical history and examine the vagina for signs of bacterial vaginosis. A sample of vaginal fluid may be taken. Your health care provider will look at this sample under a microscope to check for bacteria and abnormal cells. A vaginal pH test may also be done.  TREATMENT  Bacterial vaginosis may be treated with antibiotic medicines. These may be given in the form of a pill or a vaginal cream. A second round of antibiotics may be prescribed if the condition comes back after treatment.  HOME CARE INSTRUCTIONS   Only take over-the-counter or prescription medicines as  directed by your health care provider.  If antibiotic medicine was prescribed, take it as directed. Make sure you finish it even if you start to feel better.  Do not have sex until treatment is completed.  Tell all sexual partners that you have a vaginal infection. They should see their health care provider and be treated if they have problems, such as a mild rash or itching.  Practice safe sex by using condoms and only having one sex partner. SEEK MEDICAL CARE IF:   Your symptoms are not improving after 3 days of treatment.  You have increased discharge or pain.  You have a fever. MAKE SURE YOU:   Understand these instructions.  Will watch your condition.  Will get help right away if you are not doing well or get worse. FOR MORE INFORMATION  Centers for Disease Control and Prevention, Division of STD Prevention: www.cdc.gov/std American Sexual Health Association (ASHA): www.ashastd.org  Document Released: 03/02/2005 Document Revised: 12/21/2012 Document Reviewed: 10/12/2012 ExitCare Patient Information 2015 [employer]. This information is not intended to replace advice given to you by your health care provider. Make sure you discuss any questions you have with your health care provider.  
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
## 2013-12-06 LAB — IPS N GONORRHOEA AND CHLAMYDIA BY PCR

## 2013-12-06 NOTE — Progress Notes (Signed)
Reviewed personally.  M. Suzanne Sarrinah Gardin, MD.  

## 2013-12-18 ENCOUNTER — Telehealth: Payer: Self-pay | Admitting: Certified Nurse Midwife

## 2013-12-18 NOTE — Telephone Encounter (Signed)
Pt has been bleeding for 12 days now. Says that this isnt really her cycle she had her cycle around 11/18/13 it is time for her to take nuva ring out and start her cycle but she still hasnt stopped bleeding from the second bleed. Pt says it is almost black not red.

## 2013-12-18 NOTE — Telephone Encounter (Signed)
Spoke with patient. Advised spoke with Verner Choleborah S. Leonard CNM and she recommends that patient take nuvaring out today as this is technically her week for her cycle and come in tomorrow to further discuss everything. Patient is agreeable. Appointment scheduled for tomorrow at 2pm with Verner Choleborah S. Leonard CNM. Agreeable to date and time.  Routing to provider for final review. Patient agreeable to disposition. Will close encounter

## 2013-12-18 NOTE — Telephone Encounter (Signed)
Spoke with patient at time of incoming call. Patient started her LMP on 9/7. Placed her ring again on 9/10 and her cycle started again on 9/22. Patient states that this cycle was "heavy and a different color than usual." Patient describes the color as "almost black it was so dark red." Patient was been wearing pads until 10/2. States that bleeding is now more "spotting" but is still dark in color. Denies any abdominal pain. Patient is supposed to take her ring out today to start her cycle. "That is a lot of bleeding. I don't know what to do. I have had a lot of infections this year so I wanted to let Debbi know." Appointment scheduled for tomorrow 10/6 at 2:30pm with Verner Choleborah S. Leonard CNM. Patient is agreeable to date and time. Advised patient would speak with Verner Choleborah S. Leonard CNM about what she should do about removing and replacing her ring and give her a call back. Patient is agreeable.

## 2013-12-19 ENCOUNTER — Ambulatory Visit (INDEPENDENT_AMBULATORY_CARE_PROVIDER_SITE_OTHER): Payer: No Typology Code available for payment source | Admitting: Certified Nurse Midwife

## 2013-12-19 ENCOUNTER — Encounter: Payer: Self-pay | Admitting: Certified Nurse Midwife

## 2013-12-19 VITALS — BP 110/66 | HR 76 | Resp 16 | Ht 63.0 in | Wt 95.0 lb

## 2013-12-19 DIAGNOSIS — Z3040 Encounter for surveillance of contraceptives, unspecified: Secondary | ICD-10-CM

## 2013-12-19 NOTE — Patient Instructions (Addendum)

## 2013-12-19 NOTE — Progress Notes (Signed)
28 y.o. Single Caucasian [id number] for evaluation of Nuvaring, she has used for the past year, but due to recent stress and illness, has been" off track with when to remove and insert. Not sexually active for past month. Has ended relationship with partner of 4 years. Feeling much better now after strep infection resolved. Patient inserted new Nuvaring on 11-23-13, Plans to remove in another week, but having bleeding now. Concerned. Does not want pelvic exam unless needed. Also plans to enroll in [employer], but needs to gain 13 pounds to do so. No other health issues today.   O: Healthy female, WD WN Affect: normal orientation X 3    A: History of viral illness, followed by Strep throat, social stress with partner Unsure of Nuvaring status with removal/reinsertion  P:Discussed improving immune system with probiotic and limiting exposure to new stress with any relationship at this point. Discussed good diet with extra calories with Ensure or Protein smoothie, no sugars. Given information on good diet. Sat down with patient with calendar and discussed she should  Have taken Nuvaring out on 12-12-13. The bleeding has been her period, New Nuvaring needs to be inserted 12/21/13. Reviewed with patient the next 3 months when to insert and remove Nuvaring. Patient felt much better about and knows she can continue on time now, used for one year with issues. Questions addressed.   35 minutes spent with patient  in face to face counseling.  RV prn

## 2013-12-20 NOTE — Progress Notes (Signed)
Reviewed personally.  M. Suzanne Phylliss Strege, MD.  

## 2014-01-11 ENCOUNTER — Ambulatory Visit: Payer: No Typology Code available for payment source | Admitting: Certified Nurse Midwife

## 2014-01-18 ENCOUNTER — Telehealth: Payer: Self-pay | Admitting: Certified Nurse Midwife

## 2014-01-18 ENCOUNTER — Encounter: Payer: Self-pay | Admitting: Certified Nurse Midwife

## 2014-01-18 ENCOUNTER — Ambulatory Visit: Payer: No Typology Code available for payment source | Admitting: Certified Nurse Midwife

## 2014-01-18 NOTE — Telephone Encounter (Addendum)
Pt called at 10:28 to cancel 10:30 appointment. She got upset because she was notified that she will be charged a dnka fee for today's visit and didn't wish to reschedule.

## 2014-01-18 NOTE — Telephone Encounter (Signed)
Per shannon who spoke with patient, she said she had a lot going on at home.

## 2014-01-18 NOTE — Telephone Encounter (Signed)
Did she give a reason for cancelling?

## 2014-01-19 ENCOUNTER — Ambulatory Visit: Payer: No Typology Code available for payment source | Admitting: Certified Nurse Midwife

## 2014-02-05 NOTE — Telephone Encounter (Signed)
Left message to call Kaitlyn at [phone]. 

## 2014-02-05 NOTE — Telephone Encounter (Signed)
Pt says she is having bleeding after her period that is very heavy.

## 2014-02-06 ENCOUNTER — Ambulatory Visit (INDEPENDENT_AMBULATORY_CARE_PROVIDER_SITE_OTHER): Payer: No Typology Code available for payment source | Admitting: Certified Nurse Midwife

## 2014-02-06 ENCOUNTER — Encounter: Payer: Self-pay | Admitting: Certified Nurse Midwife

## 2014-02-06 VITALS — BP 106/76 | HR 68 | Resp 16 | Ht 63.0 in | Wt 98.0 lb

## 2014-02-06 DIAGNOSIS — N926 Irregular menstruation, unspecified: Secondary | ICD-10-CM

## 2014-02-06 DIAGNOSIS — Z113 Encounter for screening for infections with a predominantly sexual mode of transmission: Secondary | ICD-10-CM

## 2014-02-06 DIAGNOSIS — D509 Iron deficiency anemia, unspecified: Secondary | ICD-10-CM

## 2014-02-06 DIAGNOSIS — Z3049 Encounter for surveillance of other contraceptives: Secondary | ICD-10-CM

## 2014-02-06 LAB — HEMOGLOBIN, FINGERSTICK: Hemoglobin, fingerstick: 9.6 g/dL — ABNORMAL LOW (ref 12.0–16.0)

## 2014-02-06 LAB — POCT URINE PREGNANCY: Preg Test, Ur: NEGATIVE

## 2014-02-06 MED ORDER — INTEGRA PLUS PO CAPS: 1.0000 | ORAL_CAPSULE | Freq: Two times a day (BID) | ORAL | Status: DC

## 2014-02-06 MED ORDER — ETONOGESTREL-ETHINYL ESTRADIOL 0.12-0.015 MG/24HR VA RING: VAGINAL_RING | VAGINAL | Status: DC

## 2014-02-06 NOTE — Telephone Encounter (Signed)
Spoke with patient. Patient states that she is experiencing dark brown/black bleeding that is heavy. "This happened to me last month and I came in to see Debbi. The same thing is happening again." Patient states that she cannot remember when the start of her LMP was. "It was around the beginning of November."  Patient is currently using Nuvaring for contraception. Is due to remove ring on 11/26. Is having to change pad/tampon every 2 hours. Denies abdominal pain, nausea, vaginal odor. States that she has been more fatigued over this past month.Advised patient will need to be seen for evaluation with Verner Choleborah S. Leonard CNM. Patient is agreeable and has today off from work. Appointment scheduled for today at 10:30am. Patient is agreeable to date and time.  Routing to provider for final review. Patient agreeable to disposition. Will close encounter

## 2014-02-06 NOTE — Progress Notes (Signed)
28 y.o. white female g0p0 here with complaint of vaginal BTB with Nuvaring again. Patient has been consistent with Nuvaring use and had normal period 01/16/14 which lasted approximately 3-4 days, light minimal cramping. Patient put Nuvaring on time and began dark brown bleeding on day 10 of cycle and has continued to have light bleeding off and on until today, which is very light. Required mini pad only. Denies cramping until today. Due to remove Nuvaring in 2 days. Denies pelvic pain, fever or chills, just some fatigue. She is sexually active again with previous partner who was in jail. Would like GC,Chlamydia screening only.  Happy with Nuvaring and has no problems in past except, when she did not insert on time. No other health issues today.  O:Healthy female WDWN Affect: normal, orientation x 3 POCT Hgb. 9.4 previous at aex 11.4 Exam: Abdomen:non tender, soft Lymph node: no enlargement or tenderness Pelvic exam: External genital: Normal BUS: negative Vagina: scant amount of dark blood noted  noted.   Cervix: normal, non tender, no lesions, blood noted from cervix Uterus: normal, non tender Adnexa:normal, non tender, no masses or fullness noted   A:Normal pelvic exam Contraception Nuvaring with BTB STD screening Anemia   P:Discussed findings of normal pelvic exam and Nuvaring in place. Discussed BTB can occur with any contraception and usually resolves with next cycle. Due to anemia, recommend she go ahead and remove Nuvaring and insert new one to avoid another withdrawal bleed. Patient feels like this is a good idea and will insert new Nuvaring today. Encouraged patient to wear for 3 weeks and replace again with new Nuvaring and then when menses due remove and start back on regular routine to see if this will help her cycle normally again. Voiced understanding. Lab: GC,Chlamydia Discussed need to start on Rx Integra plus to help with anemia. Work on iron foods in diet which will help with  fatigue. Hydrate well daily.  Labs Iron,CBC, Ferritin, IBC Rx Integra Plus see order Patient will call if bleeding continues .  Rv prn Rv prn

## 2014-02-06 NOTE — Patient Instructions (Signed)
Iron Deficiency Anemia Anemia is a condition in which there are less red blood cells or hemoglobin in the blood than normal. Hemoglobin is the part of red blood cells that carries oxygen. Iron deficiency anemia is anemia caused by too little iron. It is the most common type of anemia. It may leave you tired and short of breath. CAUSES   Lack of iron in the diet.  Poor absorption of iron, as seen with intestinal disorders.  Intestinal bleeding.  Heavy periods. SIGNS AND SYMPTOMS  Mild anemia may not be noticeable. Symptoms may include:  Fatigue.  Headache.  Pale skin.  Weakness.  Tiredness.  Shortness of breath.  Dizziness.  Cold hands and feet.  Fast or irregular heartbeat. DIAGNOSIS  Diagnosis requires a thorough evaluation and physical exam by your health care provider. Blood tests are generally used to confirm iron deficiency anemia. Additional tests may be done to find the underlying cause of your anemia. These may include:  Testing for blood in the stool (fecal occult blood test).  A procedure to see inside the colon and rectum (colonoscopy).  A procedure to see inside the esophagus and stomach (endoscopy). TREATMENT  Iron deficiency anemia is treated by correcting the cause of the deficiency. Treatment may involve:  Adding iron-rich foods to your diet.  Taking iron supplements. Pregnant or breastfeeding women need to take extra iron because their normal diet usually does not provide the required amount.  Taking vitamins. Vitamin C improves the absorption of iron. Your health care provider may recommend that you take your iron tablets with a glass of orange juice or vitamin C supplement.  Medicines to make heavy menstrual flow lighter.  Surgery. HOME CARE INSTRUCTIONS   Take iron as directed by your health care provider.  If you cannot tolerate taking iron supplements by mouth, talk to your health care provider about taking them through a vein  (intravenously) or an injection into a muscle.  For the best iron absorption, iron supplements should be taken on an empty stomach. If you cannot tolerate them on an empty stomach, you may need to take them with food.  Do not drink milk or take antacids at the same time as your iron supplements. Milk and antacids may interfere with the absorption of iron.  Iron supplements can cause constipation. Make sure to include fiber in your diet to prevent constipation. A stool softener may also be recommended.  Take vitamins as directed by your health care provider.  Eat a diet rich in iron. Foods high in iron include liver, lean beef, whole-grain bread, eggs, dried fruit, and dark green leafy vegetables. SEEK IMMEDIATE MEDICAL CARE IF:   You faint. If this happens, do not drive. Call your local emergency services (911 in [place] if no other help is available.  You have chest pain.  You feel nauseous or vomit.  You have severe or increased shortness of breath with activity.  You feel weak.  You have a rapid heartbeat.  You have unexplained sweating.  You become light-headed when getting up from a chair or bed. MAKE SURE YOU:   Understand these instructions.  Will watch your condition.  Will get help right away if you are not doing well or get worse. Document Released: 02/28/2000 Document Revised: 03/07/2013 Document Reviewed: 11/07/2012 ExitCare Patient Information 2015 [employer]. This information is not intended to replace advice given to you by your health care provider. Make sure you discuss any questions you have with your health care provider.  

## 2014-02-07 NOTE — Progress Notes (Signed)
Reviewed personally.  M. Suzanne Chanese Hartsough, MD.  

## 2014-02-09 LAB — IPS N GONORRHOEA AND CHLAMYDIA BY PCR

## 2014-02-20 ENCOUNTER — Telehealth: Payer: Self-pay | Admitting: Certified Nurse Midwife

## 2014-02-20 NOTE — Telephone Encounter (Signed)
Continue use until end of cycle if light, should stop. She can also do Motrin 600mg  with food for 24 hours to see it will stop with use of anti inflammatory.

## 2014-02-20 NOTE — Telephone Encounter (Signed)
Spoke with patient. Advised patient of message as seen below from Deborah S. Leonard CNM. Patient is agreeable and verbalizes understanding.   Routing to provider for final review. Patient agreeable to disposition. Will close encounter   

## 2014-02-20 NOTE — Telephone Encounter (Signed)
Pt is calling to follow up with Debbi and see what she must do next

## 2014-02-20 NOTE — Telephone Encounter (Signed)
Spoke with patient. Patient was seen on 11/24 with Verner Choleborah S. Leonard CNM. "Debbi told me to insert a new Nuvaring because I had been bleeding for three weeks. It slowed the bleeding down and then it stopped for one day and started again." Patient states bleeding is light. Replaced Nuvaring on 11/24. Patient would like to know what to do next. Advised patient would speak with Verner Choleborah S. Leonard CNM and return call with further recommendations and instructions. Patient is agreeable.

## 2014-03-13 ENCOUNTER — Other Ambulatory Visit: Payer: No Typology Code available for payment source

## 2014-03-14 ENCOUNTER — Encounter: Payer: Self-pay | Admitting: Certified Nurse Midwife

## 2014-03-14 ENCOUNTER — Other Ambulatory Visit (INDEPENDENT_AMBULATORY_CARE_PROVIDER_SITE_OTHER): Payer: 59

## 2014-03-14 DIAGNOSIS — Z0000 Encounter for general adult medical examination without abnormal findings: Secondary | ICD-10-CM

## 2014-03-14 LAB — CBC
HCT: 39.1 % (ref 36.0–46.0)
HEMOGLOBIN: 13 g/dL (ref 12.0–15.0)
MCH: 30 pg (ref 26.0–34.0)
MCHC: 33.2 g/dL (ref 30.0–36.0)
MCV: 90.3 fL (ref 78.0–100.0)
MPV: 9.8 fL (ref 8.6–12.4)
PLATELETS: 231 10*3/uL (ref 150–400)
RBC: 4.33 MIL/uL (ref 3.87–5.11)
RDW: 12.6 % (ref 11.5–15.5)
WBC: 3.7 10*3/uL — AB (ref 4.0–10.5)

## 2014-03-14 LAB — IBC PANEL
%SAT: 36 % (ref 20–55)
TIBC: 409 ug/dL (ref 250–470)
UIBC: 262 ug/dL (ref 125–400)

## 2014-03-14 LAB — IRON: Iron: 147 ug/dL — ABNORMAL HIGH (ref 42–145)

## 2014-03-15 LAB — FERRITIN: Ferritin: 23 ng/mL (ref 10–291)

## 2014-03-16 ENCOUNTER — Other Ambulatory Visit: Payer: Self-pay | Admitting: Certified Nurse Midwife

## 2014-03-16 DIAGNOSIS — R899 Unspecified abnormal finding in specimens from other organs, systems and tissues: Secondary | ICD-10-CM

## 2014-03-19 ENCOUNTER — Telehealth: Payer: Self-pay

## 2014-03-19 NOTE — Telephone Encounter (Signed)
-----   Message from Verner Chol, CNM sent at 03/16/2014  9:41 AM EST ----- Notify all iron studies look great now CBC normal except for low WBC, needs to work fresh foods in diet and recheck in one month, order in please schedule, How much iron is she taking?

## 2014-03-19 NOTE — Telephone Encounter (Signed)
lmtcb

## 2014-03-19 NOTE — Telephone Encounter (Signed)
Pt is returning a call to Joy. She can be reached after 4:30

## 2014-03-20 NOTE — Telephone Encounter (Signed)
Patient notified of results as written by provider 

## 2014-04-17 ENCOUNTER — Other Ambulatory Visit (INDEPENDENT_AMBULATORY_CARE_PROVIDER_SITE_OTHER): Payer: No Typology Code available for payment source

## 2014-04-17 DIAGNOSIS — R899 Unspecified abnormal finding in specimens from other organs, systems and tissues: Secondary | ICD-10-CM

## 2014-04-17 LAB — CBC WITH DIFFERENTIAL/PLATELET
Basophils Absolute: 0 10*3/uL (ref 0.0–0.1)
Basophils Relative: 0 % (ref 0–1)
EOS PCT: 1 % (ref 0–5)
Eosinophils Absolute: 0 10*3/uL (ref 0.0–0.7)
HCT: 41.2 % (ref 36.0–46.0)
HEMOGLOBIN: 13.4 g/dL (ref 12.0–15.0)
LYMPHS PCT: 43 % (ref 12–46)
Lymphs Abs: 2 10*3/uL (ref 0.7–4.0)
MCH: 29.2 pg (ref 26.0–34.0)
MCHC: 32.5 g/dL (ref 30.0–36.0)
MCV: 89.8 fL (ref 78.0–100.0)
MONO ABS: 0.3 10*3/uL (ref 0.1–1.0)
MPV: 10.4 fL (ref 8.6–12.4)
Monocytes Relative: 7 % (ref 3–12)
Neutro Abs: 2.3 10*3/uL (ref 1.7–7.7)
Neutrophils Relative %: 49 % (ref 43–77)
PLATELETS: 227 10*3/uL (ref 150–400)
RBC: 4.59 MIL/uL (ref 3.87–5.11)
RDW: 12.8 % (ref 11.5–15.5)
WBC: 4.6 10*3/uL (ref 4.0–10.5)

## 2014-10-26 DIAGNOSIS — R768 Other specified abnormal immunological findings in serum: Secondary | ICD-10-CM

## 2014-10-26 HISTORY — DX: Other specified abnormal immunological findings in serum: R76.8

## 2014-12-05 ENCOUNTER — Ambulatory Visit: Payer: No Typology Code available for payment source | Admitting: Certified Nurse Midwife

## 2014-12-05 ENCOUNTER — Telehealth: Payer: Self-pay | Admitting: Certified Nurse Midwife

## 2014-12-05 NOTE — Telephone Encounter (Signed)
Patient rescheduled aex from today because her cycle is very heavy.

## 2014-12-26 ENCOUNTER — Ambulatory Visit (INDEPENDENT_AMBULATORY_CARE_PROVIDER_SITE_OTHER): Payer: No Typology Code available for payment source | Admitting: Certified Nurse Midwife

## 2014-12-26 ENCOUNTER — Encounter: Payer: Self-pay | Admitting: Certified Nurse Midwife

## 2014-12-26 VITALS — BP 90/60 | HR 84 | Resp 16 | Ht 63.0 in | Wt 102.0 lb

## 2014-12-26 DIAGNOSIS — Z3049 Encounter for surveillance of other contraceptives: Secondary | ICD-10-CM | POA: Diagnosis not present

## 2014-12-26 DIAGNOSIS — Z124 Encounter for screening for malignant neoplasm of cervix: Secondary | ICD-10-CM | POA: Diagnosis not present

## 2014-12-26 DIAGNOSIS — Z0000 Encounter for general adult medical examination without abnormal findings: Secondary | ICD-10-CM | POA: Diagnosis not present

## 2014-12-26 DIAGNOSIS — Z01419 Encounter for gynecological examination (general) (routine) without abnormal findings: Secondary | ICD-10-CM

## 2014-12-26 LAB — POCT URINALYSIS DIPSTICK
Bilirubin, UA: NEGATIVE
Blood, UA: NEGATIVE
Glucose, UA: NEGATIVE
Ketones, UA: NEGATIVE
LEUKOCYTES UA: NEGATIVE
NITRITE UA: NEGATIVE
Protein, UA: NEGATIVE
Urobilinogen, UA: NEGATIVE
pH, UA: 5

## 2014-12-26 LAB — CBC
HCT: 41.1 % (ref 36.0–46.0)
Hemoglobin: 13.5 g/dL (ref 12.0–15.0)
MCH: 29.5 pg (ref 26.0–34.0)
MCHC: 32.8 g/dL (ref 30.0–36.0)
MCV: 89.9 fL (ref 78.0–100.0)
MPV: 10.2 fL (ref 8.6–12.4)
PLATELETS: 295 10*3/uL (ref 150–400)
RBC: 4.57 MIL/uL (ref 3.87–5.11)
RDW: 12.3 % (ref 11.5–15.5)
WBC: 6.2 10*3/uL (ref 4.0–10.5)

## 2014-12-26 LAB — IRON: IRON: 55 ug/dL (ref 40–190)

## 2014-12-26 LAB — TSH: TSH: 0.538 u[IU]/mL (ref 0.350–4.500)

## 2014-12-26 MED ORDER — ETONOGESTREL-ETHINYL ESTRADIOL 0.12-0.015 MG/24HR VA RING: VAGINAL_RING | VAGINAL | Status: DC
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
## 2014-12-26 NOTE — Patient Instructions (Signed)
General topics  Next pap or exam is  due in 1 year Take a Women's multivitamin Take 1200 mg. of calcium daily - prefer dietary If any concerns in interim to call back  Breast Self-Awareness Practicing breast self-awareness may pick up problems early, prevent significant medical complications, and possibly save your life. By practicing breast self-awareness, you can become familiar with how your breasts look and feel and if your breasts are changing. This allows you to notice changes early. It can also offer you some reassurance that your breast health is good. One way to learn what is normal for your breasts and whether your breasts are changing is to do a breast self-exam. If you find a lump or something that was not present in the past, it is best to contact your caregiver right away. Other findings that should be evaluated by your caregiver include nipple discharge, especially if it is bloody; skin changes or reddening; areas where the skin seems to be pulled in (retracted); or new lumps and bumps. Breast pain is seldom associated with cancer (malignancy), but should also be evaluated by a caregiver. BREAST SELF-EXAM The best time to examine your breasts is 5 7 days after your menstrual period is over.  ExitCare Patient Information 2013 [employer].   Exercise to Stay Healthy Exercise helps you become and stay healthy. EXERCISE IDEAS AND TIPS Choose exercises that:  You enjoy.  Fit into your day. You do not need to exercise really hard to be healthy. You can do exercises at a slow or medium level and stay healthy. You can:  Stretch before and after working out.  Try yoga, Pilates, or tai chi.  Lift weights.  Walk fast, swim, jog, run, climb stairs, bicycle, dance, or rollerskate.  Take aerobic classes. Exercises that burn about 150 calories:  Running 1  miles in 15 minutes.  Playing volleyball for 45 to 60 minutes.  Washing and waxing a car for 45 to 60  minutes.  Playing touch football for 45 minutes.  Walking 1  miles in 35 minutes.  Pushing a stroller 1  miles in 30 minutes.  Playing basketball for 30 minutes.  Raking leaves for 30 minutes.  Bicycling 5 miles in 30 minutes.  Walking 2 miles in 30 minutes.  Dancing for 30 minutes.  Shoveling snow for 15 minutes.  Swimming laps for 20 minutes.  Walking up stairs for 15 minutes.  Bicycling 4 miles in 15 minutes.  Gardening for 30 to 45 minutes.  Jumping rope for 15 minutes.  Washing windows or floors for 45 to 60 minutes. Document Released: 04/04/2010 Document Revised: 05/25/2011 Document Reviewed: 04/04/2010 ExitCare Patient Information 2013 [employer].   Other topics ( that may be useful information):    Sexually Transmitted Disease Sexually transmitted disease (STD) refers to any infection that is passed from person to person during sexual activity. This may happen by way of saliva, semen, blood, vaginal mucus, or urine. Common STDs include:  Gonorrhea.  Chlamydia.  Syphilis.  HIV/AIDS.  Genital herpes.  Hepatitis B and C.  Trichomonas.  Human papillomavirus (HPV).  Pubic lice. CAUSES  An STD may be spread by bacteria, virus, or parasite. A person can get an STD by:  Sexual intercourse with an infected person.  Sharing sex toys with an infected person.  Sharing needles with an infected person.  Having intimate contact with the genitals, mouth, or rectal areas of an infected person. SYMPTOMS  Some people may not have any symptoms, but   they can still pass the infection to others. Different STDs have different symptoms. Symptoms include:  Painful or bloody urination.  Pain in the pelvis, abdomen, vagina, anus, throat, or eyes.  Skin rash, itching, irritation, growths, or sores (lesions). These usually occur in the genital or anal area.  Abnormal vaginal discharge.  Penile discharge in men.  Soft, flesh-colored skin growths in the  genital or anal area.  Fever.  Pain or bleeding during sexual intercourse.  Swollen glands in the groin area.  Yellow skin and eyes (jaundice). This is seen with hepatitis. DIAGNOSIS  To make a diagnosis, your caregiver may:  Take a medical history.  Perform a physical exam.  Take a specimen (culture) to be examined.  Examine a sample of discharge under a microscope.  Perform blood test TREATMENT   Chlamydia, gonorrhea, trichomonas, and syphilis can be cured with antibiotic medicine.  Genital herpes, hepatitis, and HIV can be treated, but not cured, with prescribed medicines. The medicines will lessen the symptoms.  Genital warts from HPV can be treated with medicine or by freezing, burning (electrocautery), or surgery. Warts may come back.  HPV is a virus and cannot be cured with medicine or surgery.However, abnormal areas may be followed very closely by your caregiver and may be removed from the cervix, vagina, or vulva through office procedures or surgery. If your diagnosis is confirmed, your recent sexual partners need treatment. This is true even if they are symptom-free or have a negative culture or evaluation. They should not have sex until their caregiver says it is okay. HOME CARE INSTRUCTIONS  All sexual partners should be informed, tested, and treated for all STDs.  Take your antibiotics as directed. Finish them even if you start to feel better.  Only take over-the-counter or prescription medicines for pain, discomfort, or fever as directed by your caregiver.  Rest.  Eat a balanced diet and drink enough fluids to keep your urine clear or pale yellow.  Do not have sex until treatment is completed and you have followed up with your caregiver. STDs should be checked after treatment.  Keep all follow-up appointments, Pap tests, and blood tests as directed by your caregiver.  Only use latex condoms and water-soluble lubricants during sexual activity. Do not use  petroleum jelly or oils.  Avoid alcohol and illegal drugs.  Get vaccinated for HPV and hepatitis. If you have not received these vaccines in the past, talk to your caregiver about whether one or both might be right for you.  Avoid risky sex practices that can break the skin. The only way to avoid getting an STD is to avoid all sexual activity.Latex condoms and dental dams (for oral sex) will help lessen the risk of getting an STD, but will not completely eliminate the risk. SEEK MEDICAL CARE IF:   You have a fever.  You have any new or worsening symptoms. Document Released: 05/23/2002 Document Revised: 05/25/2011 Document Reviewed: 05/30/2010 [facility] Patient Information 2013 [employer].    Domestic Abuse You are being battered or abused if someone close to you hits, pushes, or physically hurts you in any way. You also are being abused if you are forced into activities. You are being sexually abused if you are forced to have sexual contact of any kind. You are being emotionally abused if you are made to feel worthless or if you are constantly threatened. It is important to remember that help is available. No one has the right to abuse you. PREVENTION OF FURTHER  ABUSE  Learn the warning signs of danger. This varies with situations but may include: the use of alcohol, threats, isolation from friends and family, or forced sexual contact. Leave if you feel that violence is going to occur.  If you are attacked or beaten, report it to the police so the abuse is documented. You do not have to press charges. The police can protect you while you or the attackers are leaving. Get the officer's name and badge number and a copy of the report.  Find someone you can trust and tell them what is happening to you: your caregiver, a nurse, clergy member, close friend or family member. Feeling ashamed is natural, but remember that you have done nothing wrong. No one deserves abuse. Document Released:  02/28/2000 Document Revised: 05/25/2011 Document Reviewed: 05/08/2010 ExitCare Patient Information 2013 [employer].    How Much is Too Much Alcohol? Drinking too much alcohol can cause injury, accidents, and health problems. These types of problems can include:   Car crashes.  Falls.  Family fighting (domestic violence).  Drowning.  Fights.  Injuries.  Burns.  Damage to certain organs.  Having a baby with birth defects. ONE DRINK CAN BE TOO MUCH WHEN YOU ARE:  Working.  Pregnant or breastfeeding.  Taking medicines. Ask your doctor.  Driving or planning to drive. If you or someone you know has a drinking problem, get help from a doctor.  Document Released: 12/27/2008 Document Revised: 05/25/2011 Document Reviewed: 12/27/2008 ExitCare Patient Information 2013 [employer].   Smoking Hazards Smoking cigarettes is extremely bad for your health. Tobacco smoke has over 200 known poisons in it. There are over 60 chemicals in tobacco smoke that cause cancer. Some of the chemicals found in cigarette smoke include:   Cyanide.  Benzene.  Formaldehyde.  Methanol (wood alcohol).  Acetylene (fuel used in welding torches).  Ammonia. Cigarette smoke also contains the poisonous gases nitrogen oxide and carbon monoxide.  Cigarette smokers have an increased risk of many serious medical problems and Smoking causes approximately:  90% of all lung cancer deaths in men.  80% of all lung cancer deaths in women.  90% of deaths from chronic obstructive lung disease. Compared with nonsmokers, smoking increases the risk of:  Coronary heart disease by 2 to 4 times.  Stroke by 2 to 4 times.  Men developing lung cancer by 23 times.  Women developing lung cancer by 13 times.  Dying from chronic obstructive lung diseases by 12 times.  . Smoking is the most preventable cause of death and disease in our society.  WHY IS SMOKING ADDICTIVE?  Nicotine is the chemical  agent in tobacco that is capable of causing addiction or dependence.  When you smoke and inhale, nicotine is absorbed rapidly into the bloodstream through your lungs. Nicotine absorbed through the lungs is capable of creating a powerful addiction. Both inhaled and non-inhaled nicotine may be addictive.  Addiction studies of cigarettes and spit tobacco show that addiction to nicotine occurs mainly during the teen years, when young people begin using tobacco products. WHAT ARE THE BENEFITS OF QUITTING?  There are many health benefits to quitting smoking.   Likelihood of developing cancer and heart disease decreases. Health improvements are seen almost immediately.  Blood pressure, pulse rate, and breathing patterns start returning to normal soon after quitting. QUITTING SMOKING   American Lung Association - 1-800-LUNGUSA  American Cancer Society - 1-800-ACS-2345 Document Released: 04/09/2004 Document Revised: 05/25/2011 Document Reviewed: 12/12/2008 ExitCare Patient Information 2013 [employer]   LL[employer].   Stress Management Stress is a state of physical or mental tension that often results from changes in your life or normal routine. Some common causes of stress are:  Death of a loved one.  Injuries or severe illnesses.  Getting fired or changing jobs.  Moving into a new home. Other causes may be:  Sexual problems.  Business or financial losses.  Taking on a large debt.  Regular conflict with someone at home or at work.  Constant tiredness from lack of sleep. It is not just bad things that are stressful. It may be stressful to:  Win the lottery.  Get married.  Buy a new car. The amount of stress that can be easily tolerated varies from person to person. Changes generally cause stress, regardless of the types of change. Too much stress can affect your health. It may lead to physical or emotional problems. Too little stress (boredom) may also become stressful. SUGGESTIONS TO  REDUCE STRESS:  Talk things over with your family and friends. It often is helpful to share your concerns and worries. If you feel your problem is serious, you may want to get help from a professional counselor.  Consider your problems one at a time instead of lumping them all together. Trying to take care of everything at once may seem impossible. List all the things you need to do and then start with the most important one. Set a goal to accomplish 2 or 3 things each day. If you expect to do too many in a single day you will naturally fail, causing you to feel even more stressed.  Do not use alcohol or drugs to relieve stress. Although you may feel better for a short time, they do not remove the problems that caused the stress. They can also be habit forming.  Exercise regularly - at least 3 times per week. Physical exercise can help to relieve that "uptight" feeling and will relax you.  The shortest distance between despair and hope is often a good night's sleep.  Go to bed and get up on time allowing yourself time for appointments without being rushed.  Take a short "time-out" period from any stressful situation that occurs during the day. Close your eyes and take some deep breaths. Starting with the muscles in your face, tense them, hold it for a few seconds, then relax. Repeat this with the muscles in your neck, shoulders, hand, stomach, back and legs.  Take good care of yourself. Eat a balanced diet and get plenty of rest.  Schedule time for having fun. Take a break from your daily routine to relax. HOME CARE INSTRUCTIONS   Call if you feel overwhelmed by your problems and feel you can no longer manage them on your own.  Return immediately if you feel like hurting yourself or someone else. Document Released: 08/26/2000 Document Revised: 05/25/2011 Document Reviewed: 04/18/2007 ExitCare Patient Information 2013 [employer].   

## 2014-12-27 LAB — STD PANEL
HIV: NONREACTIVE
Hepatitis B Surface Ag: NEGATIVE

## 2014-12-27 LAB — WET PREP BY MOLECULAR PROBE
Candida species: POSITIVE — AB
Gardnerella vaginalis: NEGATIVE
Trichomonas vaginosis: NEGATIVE

## 2014-12-27 LAB — VITAMIN D 25 HYDROXY (VIT D DEFICIENCY, FRACTURES): Vit D, 25-Hydroxy: 21 ng/mL — ABNORMAL LOW (ref 30–100)

## 2014-12-27 LAB — IPS PAP TEST WITH REFLEX TO HPV

## 2014-12-27 LAB — HEPATITIS C ANTIBODY: HCV Ab: REACTIVE — AB

## 2014-12-27 NOTE — Progress Notes (Signed)
Encounter reviewed Lisa Higinbotham, MD   

## 2014-12-28 LAB — HEPATITIS C RNA QUANTITATIVE
HCV QUANT LOG: 6.5 {Log} — AB (ref <1.18)
HCV Quantitative: 3168358 IU/mL — ABNORMAL HIGH (ref <15)

## 2014-12-29 LAB — IPS N GONORRHOEA AND CHLAMYDIA BY PCR

## 2014-12-31 ENCOUNTER — Telehealth: Payer: Self-pay | Admitting: Nurse Practitioner

## 2014-12-31 NOTE — Telephone Encounter (Signed)
Per DR. Edward JollySilva after reviewing the lab for Ms. Lisa Knight about Hep C test we feel strongly that patient needs to return to specialist.  She is given her test results of all test including the Hep C RNA quantitative results.  She will call Dr. Sharl MaKerr who she has seen in the past.  She states she did not actually take any treatment for Hep C other than her own treatment with home remedy.  She states she will call today and schedule.  She is also read the my chart message from Friday about her yeast infection which she had not read yet and has not gone for her medication.  She is informed that med's was sent to her pharmacy. The other test are all discussed with CBC, HIV, RPR, Hep B, GC/CHL, Vit d, TSH, pap, iron.

## 2014-12-31 NOTE — Telephone Encounter (Signed)
This will need to be reported to the health department.  We will need follow up from her visit with her provider.  Cc- Billie RuddySally Yeakley

## 2014-12-31 NOTE — Telephone Encounter (Signed)
See note from Dr.Silva

## 2015-01-01 ENCOUNTER — Telehealth: Payer: Self-pay | Admitting: Certified Nurse Midwife

## 2015-01-01 NOTE — Telephone Encounter (Signed)
Patient wants to talk with the nurse about setting up an referral. She states she thought she already had a doctor but it turned out to be the wrong kind of doctor.

## 2015-01-01 NOTE — Telephone Encounter (Signed)
Call to Health department. Since her Hep C is an old infection and not an acute infection, it does not get reported to them.

## 2015-01-02 ENCOUNTER — Other Ambulatory Visit: Payer: Self-pay | Admitting: Certified Nurse Midwife

## 2015-01-02 ENCOUNTER — Encounter: Payer: Self-pay | Admitting: Obstetrics and Gynecology

## 2015-01-02 DIAGNOSIS — R768 Other specified abnormal immunological findings in serum: Secondary | ICD-10-CM

## 2015-01-02 NOTE — Telephone Encounter (Addendum)
Message left to return call to [place] at [phone].   Debbi can you review and advise. Patient may need referral for evaluation of Hep C positive blood work. Do you have recommendation for provider? Patient states [facility] [place] [facility] [place] [facility] and needs new referral.  Advised patient to request records from this provider and will return call with referral information.

## 2015-01-02 NOTE — Telephone Encounter (Signed)
I have another patient who is being seen by the [facility] [place] and have others that were seen . I feel this is the best choice. I will put in order for referral to.

## 2015-01-03 NOTE — Telephone Encounter (Signed)
Patient notified referral entered. Will be contact by referral coordinator to schedule. Patient agreeable.

## 2015-01-06 ENCOUNTER — Encounter: Payer: Self-pay | Admitting: Certified Nurse Midwife

## 2015-01-07 ENCOUNTER — Telehealth: Payer: Self-pay | Admitting: Emergency Medicine

## 2015-01-07 MED ORDER — HYLAFEM VA SUPP: 1.0000 | Freq: Every day | VAGINAL | Status: DC

## 2015-01-07 MED ORDER — TERCONAZOLE 0.4 % VA CREA: TOPICAL_CREAM | VAGINAL | Status: DC

## 2015-01-07 NOTE — Telephone Encounter (Signed)
Lisa Knight,  Patient had yeast on her pap and affirm.  Order for Hylafem needed.  Can you advise?

## 2015-01-07 NOTE — Telephone Encounter (Signed)
DC order for Diflucan as patient has not had recent liver function testing.  Discussed with Lisa Knight CommentPatricia Grubb, FNP okay for Terazol 7. Discussed patient history of side effects, itching and burning with OTC Monistat. Patient declines to come in for blood work due to potential cost. Patient reports having small cuts in perineal area that she feels is related to yeast irritation.   Discussed order for Terazol 7 and potential common reaction for vulvovaginal burning. Advised patient to place pea sized amount to vulva to test skin reaction. If develops burning can remove cream with warm washcloth, however, burning is common reaction and skin is irritated due to yeast. Patient to call tomorrow with update.  Prescription sent to pharmacy for Terazol 7 and patient agreeable to instructions.  Okay per Dr. Oscar LaJertson.  Routing to provider for final review. Patient agreeable to disposition. Will close encounter.

## 2015-01-07 NOTE — Telephone Encounter (Signed)
[facility] They no longer have a [facility] Clinic in [place]. They are located in [place]. Referral faxed to [facility] to [phone] phone and fax [phone].   cc Lilyan GilfordBecky Frahm for follow up and patient contact.  Routing to provider for final review. Patient agreeable to disposition. Will close encounter.

## 2015-01-07 NOTE — Telephone Encounter (Signed)
OK for Diflucan # 2 tabs as directed.

## 2015-01-07 NOTE — Telephone Encounter (Signed)
Chief Complaint  Patient presents with  . Advice Only    Patient sent mychart message   . Results  . Medication Management    ===View-only below this line===   ----- Message -----    From: Eliot FordKINNEY,Nastacia C    Sent: 01/06/2015  2:46 PM EDT      To: Leota SauersLEONARD,DEBORAH, CNM Subject: Non-Urgent Medical Question  I saw in my chart that my test results cam back positive for yeast and you sent Hylafem to the pharmacy in [place]. When went to get it they did not have it in the computer.

## 2015-01-07 NOTE — Telephone Encounter (Signed)
Message left to return call to [place] at [phone].  Prescription placed.

## 2015-01-07 NOTE — Telephone Encounter (Signed)
Telephone call for triage created to discuss message with patient and disposition as appropriate.   

## 2015-01-07 NOTE — Telephone Encounter (Signed)
Patient returning call.

## 2015-01-07 NOTE — Telephone Encounter (Signed)
Spoke with patient. She is requesting a prescription for Diflucan instead of the hylafem because she has allergy to Monistat and is afraid that if she places anything in vagina she will have a reaction. Has used Diflucan in the past and it works well for her. Advised will send message to covering provider and return call. Patient agreeable.

## 2015-01-07 NOTE — Telephone Encounter (Signed)
She may have RX for Hylafem sent to her pharmacy.

## 2015-01-08 ENCOUNTER — Other Ambulatory Visit: Payer: Self-pay | Admitting: Nurse Practitioner

## 2015-01-08 NOTE — Telephone Encounter (Signed)
Thank you for following up with this and making this patient has follow up information.

## 2015-01-15 ENCOUNTER — Telehealth: Payer: Self-pay | Admitting: Emergency Medicine

## 2015-01-15 ENCOUNTER — Other Ambulatory Visit: Payer: Self-pay | Admitting: Certified Nurse Midwife

## 2015-01-15 DIAGNOSIS — R768 Other specified abnormal immunological findings in serum: Secondary | ICD-10-CM

## 2015-01-15 NOTE — Telephone Encounter (Signed)
Calling patient with message from Leota SauersDeborah Leonard CNM.  Will need lab appointment. Message left to return call to [place] at [phone].

## 2015-01-15 NOTE — Telephone Encounter (Signed)
-----   Message from Verner Choleborah S Leonard, CNM sent at 01/15/2015  8:30 AM EDT ----- Regarding: Lab needed for referral appointment and  assessment  Hep.C Genotype Please notify that the [facility] her referral, but she has to have the Hepatitis C Genotype results for their ability to complete her referral and schedule her in the appropriate clinic. Order in Please schedule lab appointment.

## 2015-01-15 NOTE — Telephone Encounter (Signed)
Patient returned call. She is agreeable to lab appointment and scheduled for 01/16/15 at 1400. Routing to provider for final review. Patient agreeable to disposition. Will close encounter.

## 2015-01-16 ENCOUNTER — Other Ambulatory Visit (INDEPENDENT_AMBULATORY_CARE_PROVIDER_SITE_OTHER): Payer: No Typology Code available for payment source

## 2015-01-16 DIAGNOSIS — R768 Other specified abnormal immunological findings in serum: Secondary | ICD-10-CM

## 2015-01-16 DIAGNOSIS — R894 Abnormal immunological findings in specimens from other organs, systems and tissues: Secondary | ICD-10-CM

## 2015-01-23 LAB — HEPATITIS C GENOTYPE

## 2015-01-24 ENCOUNTER — Telehealth: Payer: Self-pay | Admitting: Emergency Medicine

## 2015-01-24 NOTE — Telephone Encounter (Signed)
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 This is identified to determine the type treatment recommended, this needs to be put on the referral form and faxed for referral to be completed for her appointment

## 2015-01-24 NOTE — Telephone Encounter (Signed)
Lab and referral form fax[facility] to [facility] with fax confirmation received.   Message left to return call to [place] at [phone].

## 2015-01-25 NOTE — Telephone Encounter (Signed)
Patient notified of message from Leota SauersDeborah Leonard CNM and agreeable. Advised will contact her with referral information when available.  Routing to provider for final review. Patient agreeable to disposition. Will close encounter.

## 2015-03-07 ENCOUNTER — Other Ambulatory Visit (HOSPITAL_COMMUNITY): Payer: Self-pay | Admitting: Gastroenterology

## 2015-03-07 DIAGNOSIS — B180 Chronic viral hepatitis B with delta-agent: Secondary | ICD-10-CM

## 2015-04-10 ENCOUNTER — Ambulatory Visit (HOSPITAL_COMMUNITY)
Admission: RE | Admit: 2015-04-10 | Discharge: 2015-04-10 | Disposition: A | Discharge status: Home / Self Care | Payer: No Typology Code available for payment source | Source: Ambulatory Visit | Attending: Gastroenterology | Admitting: Gastroenterology

## 2015-04-10 DIAGNOSIS — B1920 Unspecified viral hepatitis C without hepatic coma: Secondary | ICD-10-CM | POA: Diagnosis present

## 2015-04-10 DIAGNOSIS — B180 Chronic viral hepatitis B with delta-agent: Secondary | ICD-10-CM

## 2015-05-16 ENCOUNTER — Emergency Department (HOSPITAL_COMMUNITY): Payer: No Typology Code available for payment source

## 2015-05-16 ENCOUNTER — Encounter (HOSPITAL_COMMUNITY): Payer: Self-pay

## 2015-05-16 ENCOUNTER — Emergency Department (HOSPITAL_COMMUNITY)
Admission: EM | Admit: 2015-05-16 | Discharge: 2015-05-16 | Disposition: A | Discharge status: Home / Self Care | Payer: No Typology Code available for payment source | Attending: Emergency Medicine | Admitting: Emergency Medicine

## 2015-05-16 DIAGNOSIS — Z8619 Personal history of other infectious and parasitic diseases: Secondary | ICD-10-CM | POA: Insufficient documentation

## 2015-05-16 DIAGNOSIS — Z79899 Other long term (current) drug therapy: Secondary | ICD-10-CM | POA: Insufficient documentation

## 2015-05-16 DIAGNOSIS — Z8639 Personal history of other endocrine, nutritional and metabolic disease: Secondary | ICD-10-CM | POA: Insufficient documentation

## 2015-05-16 DIAGNOSIS — R200 Anesthesia of skin: Secondary | ICD-10-CM | POA: Insufficient documentation

## 2015-05-16 DIAGNOSIS — R079 Chest pain, unspecified: Secondary | ICD-10-CM | POA: Diagnosis not present

## 2015-05-16 DIAGNOSIS — R0602 Shortness of breath: Secondary | ICD-10-CM | POA: Diagnosis not present

## 2015-05-16 LAB — D-DIMER, QUANTITATIVE: D-Dimer, Quant: <0.27 ug/mL-FEU (ref 0.00–0.50)

## 2015-05-16 LAB — CBC
HCT: 38.3 % (ref 36.0–46.0)
Hemoglobin: 12.3 g/dL (ref 12.0–15.0)
MCH: 28.5 pg (ref 26.0–34.0)
MCHC: 32.1 g/dL (ref 30.0–36.0)
MCV: 88.7 fL (ref 78.0–100.0)
Platelets: 285 10*3/uL (ref 150–400)
RBC: 4.32 MIL/uL (ref 3.87–5.11)
RDW: 12 % (ref 11.5–15.5)
WBC: 5.9 10*3/uL (ref 4.0–10.5)

## 2015-05-16 LAB — HEPATIC FUNCTION PANEL
ALT: 47 U/L (ref 14–54)
AST: 31 U/L (ref 15–41)
Albumin: 3.6 g/dL (ref 3.5–5.0)
Alkaline Phosphatase: 39 U/L (ref 38–126)
Bilirubin, Direct: 0.1 mg/dL (ref 0.1–0.5)
Indirect Bilirubin: 0.6 mg/dL (ref 0.3–0.9)
Total Bilirubin: 0.7 mg/dL (ref 0.3–1.2)
Total Protein: 6.6 g/dL (ref 6.5–8.1)

## 2015-05-16 LAB — I-STAT TROPONIN, ED: Troponin i, poc: 0 ng/mL (ref 0.00–0.08)

## 2015-05-16 LAB — BASIC METABOLIC PANEL
Anion gap: 13 (ref 5–15)
BUN: 10 mg/dL (ref 6–20)
CO2: 19 mmol/L — ABNORMAL LOW (ref 22–32)
Calcium: 9.2 mg/dL (ref 8.9–10.3)
Chloride: 108 mmol/L (ref 101–111)
Creatinine, Ser: 0.67 mg/dL (ref 0.44–1.00)
GFR calc Af Amer: >60 mL/min (ref >60)
GFR calc non Af Amer: >60 mL/min (ref >60)
Glucose, Bld: 90 mg/dL (ref 65–99)
Potassium: 4 mmol/L (ref 3.5–5.1)
Sodium: 140 mmol/L (ref 135–145)

## 2015-05-16 NOTE — ED Notes (Signed)
Pt in xray

## 2015-05-16 NOTE — ED Notes (Addendum)
Patient here with shortness of breath and CP since 0800, denies CP on arrival but complains of some left sided neck pain. Started new med for Hep c today

## 2015-05-16 NOTE — Discharge Instructions (Signed)
1. Medications: usual home medications °2. Treatment: rest, drink plenty of fluids °3. Follow Up: please followup with your primary doctor for discussion of your diagnoses and further evaluation after today's visit; please return to the ER for new or worsening symptoms ° ° °Nonspecific Chest Pain  °Chest pain can be caused by many different conditions. There is always a chance that your pain could be related to something serious, such as a heart attack or a blood clot in your lungs. Chest pain can also be caused by conditions that are not life-threatening. If you have chest pain, it is very important to follow up with your health care provider. °CAUSES  °Chest pain can be caused by: °· Heartburn. °· Pneumonia or bronchitis. °· Anxiety or stress. °· Inflammation around your heart (pericarditis) or lung (pleuritis or pleurisy). °· A blood clot in your lung. °· A collapsed lung (pneumothorax). It can develop suddenly on its own (spontaneous pneumothorax) or from trauma to the chest. °· Shingles infection (varicella-zoster virus). °· Heart attack. °· Damage to the bones, muscles, and cartilage that make up your chest wall. This can include: °¨ Bruised bones due to injury. °¨ Strained muscles or cartilage due to frequent or repeated coughing or overwork. °¨ Fracture to one or more ribs. °¨ Sore cartilage due to inflammation (costochondritis). °RISK FACTORS  °Risk factors for chest pain may include: °· Activities that increase your risk for trauma or injury to your chest. °· Respiratory infections or conditions that cause frequent coughing. °· Medical conditions or overeating that can cause heartburn. °· Heart disease or family history of heart disease. °· Conditions or health behaviors that increase your risk of developing a blood clot. °· Having had chicken pox (varicella zoster). °SIGNS AND SYMPTOMS °Chest pain can feel like: °· Burning or tingling on the surface of your chest or deep in your chest. °· Crushing,  pressure, aching, or squeezing pain. °· Dull or sharp pain that is worse when you move, cough, or take a deep breath. °· Pain that is also felt in your back, neck, shoulder, or arm, or pain that spreads to any of these areas. °Your chest pain may come and go, or it may stay constant. °DIAGNOSIS °Lab tests or other studies may be needed to find the cause of your pain. Your health care provider may have you take a test called an ambulatory ECG (electrocardiogram). An ECG records your heartbeat patterns at the time the test is performed. You may also have other tests, such as: °· Transthoracic echocardiogram (TTE). During echocardiography, sound waves are used to create a picture of all of the heart structures and to look at how blood flows through your heart. °· Transesophageal echocardiogram (TEE). This is a more advanced imaging test that obtains images from inside your body. It allows your health care provider to see your heart in finer detail. °· Cardiac monitoring. This allows your health care provider to monitor your heart rate and rhythm in real time. °· Holter monitor. This is a portable device that records your heartbeat and can help to diagnose abnormal heartbeats. It allows your health care provider to track your heart activity for several days, if needed. °· Stress tests. These can be done through exercise or by taking medicine that makes your heart beat more quickly. °· Blood tests. °· Imaging tests. °TREATMENT  °Your treatment depends on what is causing your chest pain. Treatment may include: °· Medicines. These may include: °¨ Acid blockers for heartburn. °¨ Anti-inflammatory medicine. °¨   Pain medicine for inflammatory conditions. °¨ Antibiotic medicine, if an infection is present. °¨ Medicines to dissolve blood clots. °¨ Medicines to treat coronary artery disease. °· Supportive care for conditions that do not require medicines. This may include: °¨ Resting. °¨ Applying heat or cold packs to injured  areas. °¨ Limiting activities until pain decreases. °HOME CARE INSTRUCTIONS °· If you were prescribed an antibiotic medicine, finish it all even if you start to feel better. °· Avoid any activities that bring on chest pain. °· Do not use any tobacco products, including cigarettes, chewing tobacco, or electronic cigarettes. If you need help quitting, ask your health care provider. °· Do not drink alcohol. °· Take medicines only as directed by your health care provider. °· Keep all follow-up visits as directed by your health care provider. This is important. This includes any further testing if your chest pain does not go away. °· If heartburn is the cause for your chest pain, you may be told to keep your head raised (elevated) while sleeping. This reduces the chance that acid will go from your stomach into your esophagus. °· Make lifestyle changes as directed by your health care provider. These may include: °¨ Getting regular exercise. Ask your health care provider to suggest some activities that are safe for you. °¨ Eating a heart-healthy diet. A registered dietitian can help you to learn healthy eating options. °¨ Maintaining a healthy weight. °¨ Managing diabetes, if necessary. °¨ Reducing stress. °SEEK MEDICAL CARE IF: °· Your chest pain does not go away after treatment. °· You have a rash with blisters on your chest. °· You have a fever. °SEEK IMMEDIATE MEDICAL CARE IF:  °· Your chest pain is worse. °· You have an increasing cough, or you cough up blood. °· You have severe abdominal pain. °· You have severe weakness. °· You faint. °· You have chills. °· You have sudden, unexplained chest discomfort. °· You have sudden, unexplained discomfort in your arms, back, neck, or jaw. °· You have shortness of breath at any time. °· You suddenly start to sweat, or your skin gets clammy. °· You feel nauseous or you vomit. °· You suddenly feel light-headed or dizzy. °· Your heart begins to beat quickly, or it feels like it  is skipping beats. °These symptoms may represent a serious problem that is an emergency. Do not wait to see if the symptoms will go away. Get medical help right away. Call your local emergency services (911 in the [place] Do not drive yourself to the hospital. °  °This information is not intended to replace advice given to you by your health care provider. Make sure you discuss any questions you have with your health care provider. °  °Document Released: 12/10/2004 Document Revised: 03/23/2014 Document Reviewed: 10/06/2013 °Elsevier Interactive Patient Education ©2016 [employer] ° ° °

## 2015-05-16 NOTE — ED Provider Notes (Signed)
CSN: [id number]     Arrival date & time 05/16/15  1104 History   First MD Initiated Contact with Patient 05/16/15 1145     Chief Complaint  Patient presents with  . Chest Pain    HPI   Lisa Knight is a 30 y.o. female with a PMH of hepatitis C who presents to the ED with left-sided chest pain, which she states started around 8 AM this morning. She denies precipitating or exacerbating factors. She states she was given oxygen by EMS, which provided some symptom relief. Currently, she denies chest pain. She also notes shortness of breath and left hand numbness. She states she started harvoni today for hepatitis C, and thinks this may be contributing to her symptoms. She denies fever, chills, cough, congestion, abdominal pain, nausea, vomiting, recent travel or immobility, recent surgery, history of malignancy, history of DVT or PE.   Past Medical History  Diagnosis Date  . History of abnormal Pap smear 2006    with LEEP  . Hyperthyroidism   . Hepatitis C antibody test positive 10/26/14    no treatment to date?   Past Surgical History  Procedure Laterality Date  . Cervical biopsy  w/ loop electrode excision  2006  . Colposcopy  2006   No family history on file. Social History  Substance Use Topics  . Smoking status: Never Smoker   . Smokeless tobacco: Never Used  . Alcohol Use: Yes     Comment: occasional   OB History    Gravida Para Term Preterm AB TAB SAB Ectopic Multiple Living         Review of Systems  Constitutional: Negative for fever and chills.  Respiratory: Positive for shortness of breath. Negative for cough.   Cardiovascular: Positive for chest pain. Negative for leg swelling.  Gastrointestinal: Negative for nausea, vomiting and abdominal pain.  Neurological: Positive for numbness.  All other systems reviewed and are negative.     Allergies  Other and Monistat  Home Medications   Prior to Admission medications   Medication Sig  Start Date End Date Taking? Authorizing Provider  etonogestrel-ethinyl estradiol (NUVARING) 0.12-0.015 MG/24HR vaginal ring Insert vaginally and leave in place for 3 consecutive weeks replace 12/26/14   Verner Chol, CNM  Homeopathic Products (HYLAFEM) SUPP Place 1 capsule vaginally at bedtime. For 3 nights. If symptoms persist, repeat treatment for 3 nights. 01/07/15   Ria Comment, FNP  terconazole (TERAZOL 7) 0.4 % vaginal cream Place one applicator vaginally at bedtime for 7 nights. 01/07/15   Ria Comment, FNP    BP 105/83 mmHg  Pulse 87  Resp 19  SpO2 99%  LMP 04/29/2015 Physical Exam  Constitutional: She is oriented to person, place, and time. She appears well-developed and well-nourished. No distress.  HENT:  Head: Normocephalic and atraumatic.  Right Ear: External ear normal.  Left Ear: External ear normal.  Nose: Nose normal.  Mouth/Throat: Uvula is midline, oropharynx is clear and moist and mucous membranes are normal.  Eyes: Conjunctivae, EOM and lids are normal. Pupils are equal, round, and reactive to light. Right eye exhibits no discharge. Left eye exhibits no discharge. No scleral icterus.  Neck: Normal range of motion. Neck supple.  Cardiovascular: Normal rate, regular rhythm, normal heart sounds, intact distal pulses and normal pulses.   Pulmonary/Chest: Effort normal and breath sounds normal. No respiratory distress. She has no wheezes. She has no rales. She exhibits no tenderness.  Abdominal: Soft. Normal appearance and bowel sounds are normal. She exhibits no distension and no mass. There is no tenderness. There is no rigidity, no rebound and no guarding.  Musculoskeletal: Normal range of motion. She exhibits no edema or tenderness.  Neurological: She is alert and oriented to person, place, and time. She has normal strength. No cranial nerve deficit or sensory deficit.  Skin: Skin is warm, dry and intact. No rash noted. She is not diaphoretic. No erythema. No  pallor.  Psychiatric: She has a normal mood and affect. Her speech is normal and behavior is normal.  Nursing note and vitals reviewed.   ED Course  Procedures (including critical care time)  Labs Review Labs Reviewed  BASIC METABOLIC PANEL - Abnormal; Notable for the following:    CO2 19 (*)    All other components within normal limits  CBC  HEPATIC FUNCTION PANEL  D-DIMER, QUANTITATIVE (NOT AT [facility])  Rosezena Sensor, ED    Imaging Review Dg Chest 2 View  05/16/2015  CLINICAL DATA:  Shortness of breath, LEFT upper chest pain for 2 days EXAM: CHEST  2 VIEW COMPARISON:  10/01/2011 FINDINGS: Normal heart size, mediastinal contours, and pulmonary vascularity. Lungs mildly hyperinflated but clear. No infiltrate, pleural effusion or pneumothorax. Bones unremarkable. IMPRESSION: No acute abnormalities. Electronically Signed   By: Ulyses Southward M.D.   On: 05/16/2015 12:06   I have personally reviewed and evaluated these images and lab results as part of my medical decision-making.   EKG Interpretation   Date/Time:  Thursday May 16 2015 11:19:20 EST Ventricular Rate:  102 PR Interval:  124 QRS Duration: 78 QT Interval:  322 QTC Calculation: 419 R Axis:   79 Text Interpretation:  Sinus tachycardia Right atrial enlargement  Borderline ECG No previous ECGs available Confirmed by YAO  MD, DAVID  ([id number]) on 05/16/2015 11:57:20 AM      MDM   Final diagnoses:  Chest pain, unspecified chest pain type    30 year old female presents with chest pain. Notes her symptoms started this morning and are now resolved. Also reports shortness of breath. Denies fever, chills, cough, congestion, abdominal pain, nausea, vomiting, recent travel or immobility, recent surgery, history of malignancy, history of DVT or PE.  Patient is afebrile. Vital signs stable. Heart regular rate and rhythm. Lungs clear to auscultation bilaterally. No lower extremity edema.  EKG sinus tachycardia, heart rate 102.  Troponin negative. D-dimer negative. Chest x-ray no acute abnormalities. CBC, BMP, hepatic function panel unremarkable.  Patient is nontoxic and well-appearing. Feel she is stable for discharge at this time. Low suspicion for ACS or PE given negative work-up in the ED and no significant risk factors. Patient to follow-up with PCP for further evaluation and management. Return precautions discussed. Patient verbalizes her understanding and is in agreement with plan.  BP 105/83 mmHg  Pulse 87  Resp 19  SpO2 99%  LMP 04/29/2015     Mady Gemma, PA-C 05/16/15 1355  Richardean Canal, MD 05/16/15 [phone]

## 2015-06-05 ENCOUNTER — Telehealth: Payer: Self-pay | Admitting: *Deleted

## 2015-06-05 NOTE — Telephone Encounter (Signed)
Patient called back she said she had requested her rx from the [facility] here [place] patient says she never picked it up because she had the flu, because of this the [facility] put her prescription back, so patient couldn't pick it up. She then requested from her [facility] in [place] and to them it looked like she requested her refill too soon even though she didn't pick it up. Patient says she will just wait until her cycle starts and insert a new ring, she is not sexually active right now but is aware to use BUM if she decides too.  Routed to provider for review.

## 2015-06-05 NOTE — Telephone Encounter (Signed)
Left Message To Call Back  

## 2015-06-05 NOTE — Telephone Encounter (Signed)
Patient calling in regards to Nuvaring prescription, she said she has had issues getting her Nuvaring. 12/26/2014 #1 ring/12 rfs [facility]. Called and [facility]w [facility] specialist she said patient has requested refill to soon. In order for patient to get prescription she would need to pay out of pocket now or she could wait until 06/20/15 to pick up rx so insurance can pay. Patient's concern was what she needed to do when she received the Nuvaring because she hasn't had it in for 3 days. S/w Yvonna AlanisKaitlyn, RN and patient needs to use BUM regardless whether she decides to pay for Nuvaring out of pocket now or wait until 06/20/15 to pick up.  Left Message for patient to call back.

## 2015-06-05 NOTE — Telephone Encounter (Signed)
Patient returned call and will try back at 3:00 this afternoon.

## 2015-10-03 IMAGING — CR DG FOOT COMPLETE 3+V*R*
1 series · 3 of 3 positions shown · non-contrast
Comparison: None.

CLINICAL DATA: Medical clearance

EXAM:
RIGHT FOOT COMPLETE - 3+ VIEW

[Series 1: AP · right · 3 of 3 slices shown]
[im 1/3]
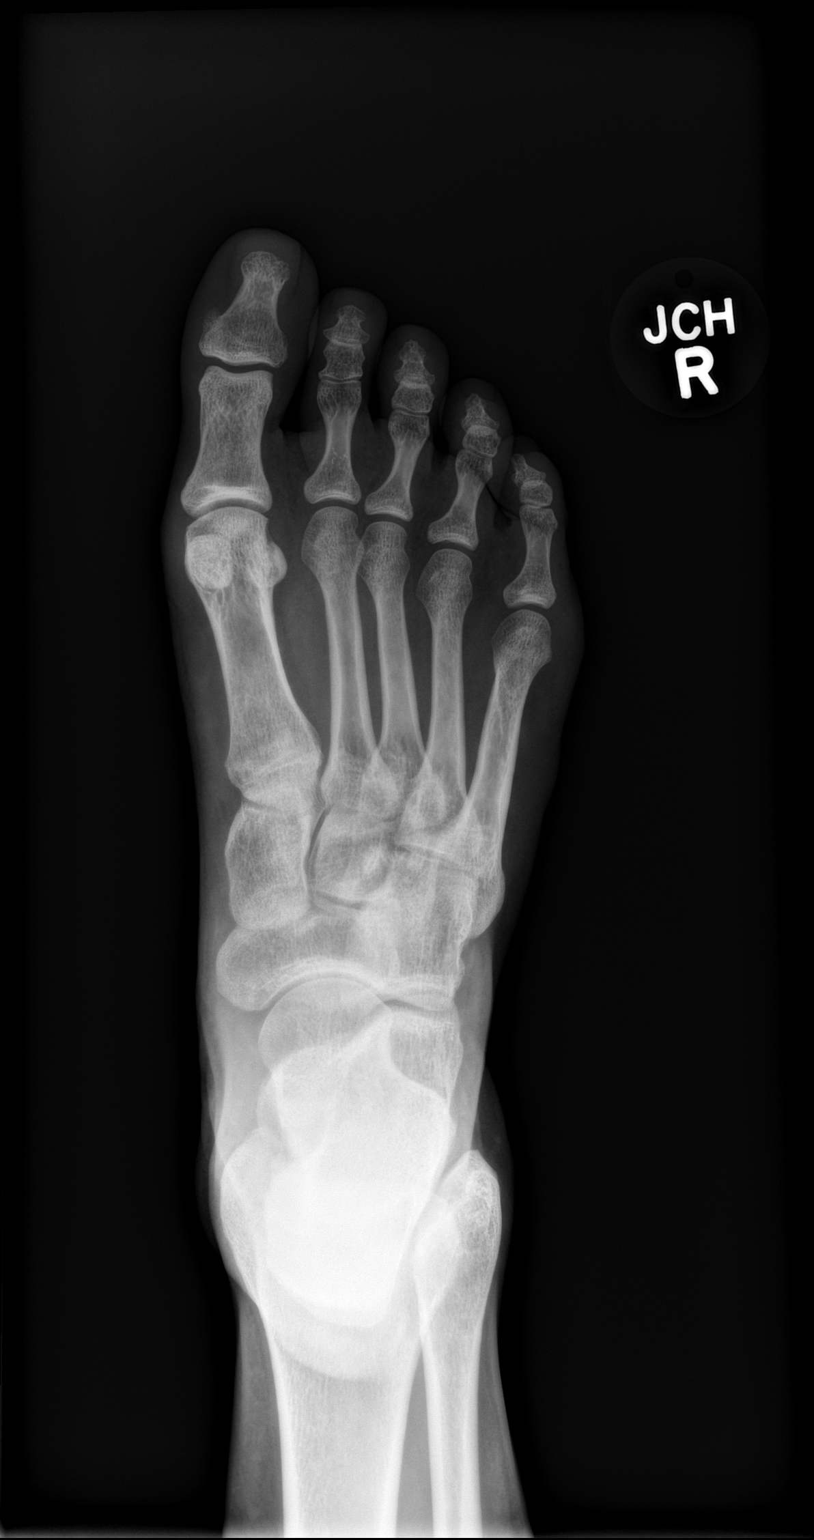
[im 2/3]
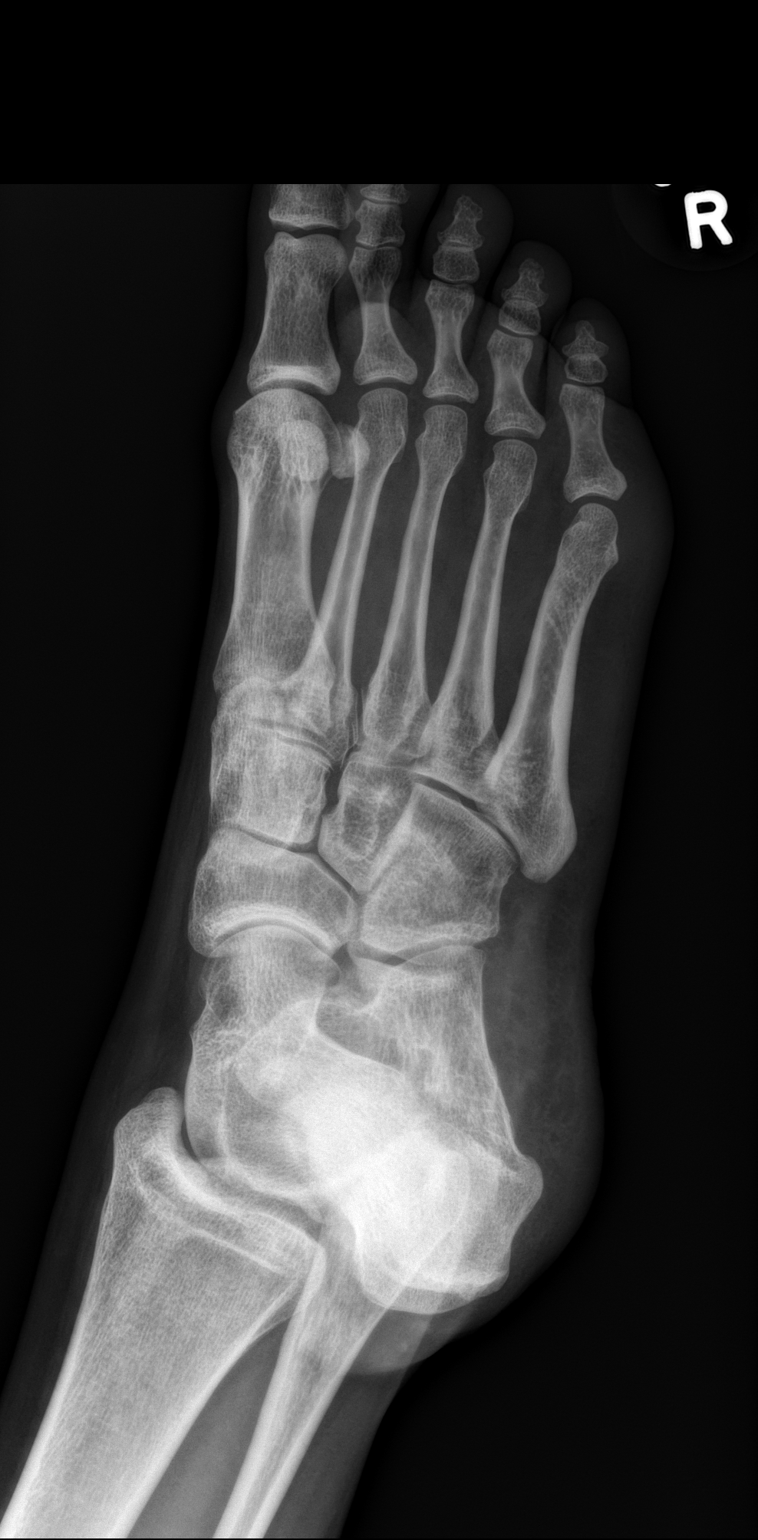
[im 3/3]
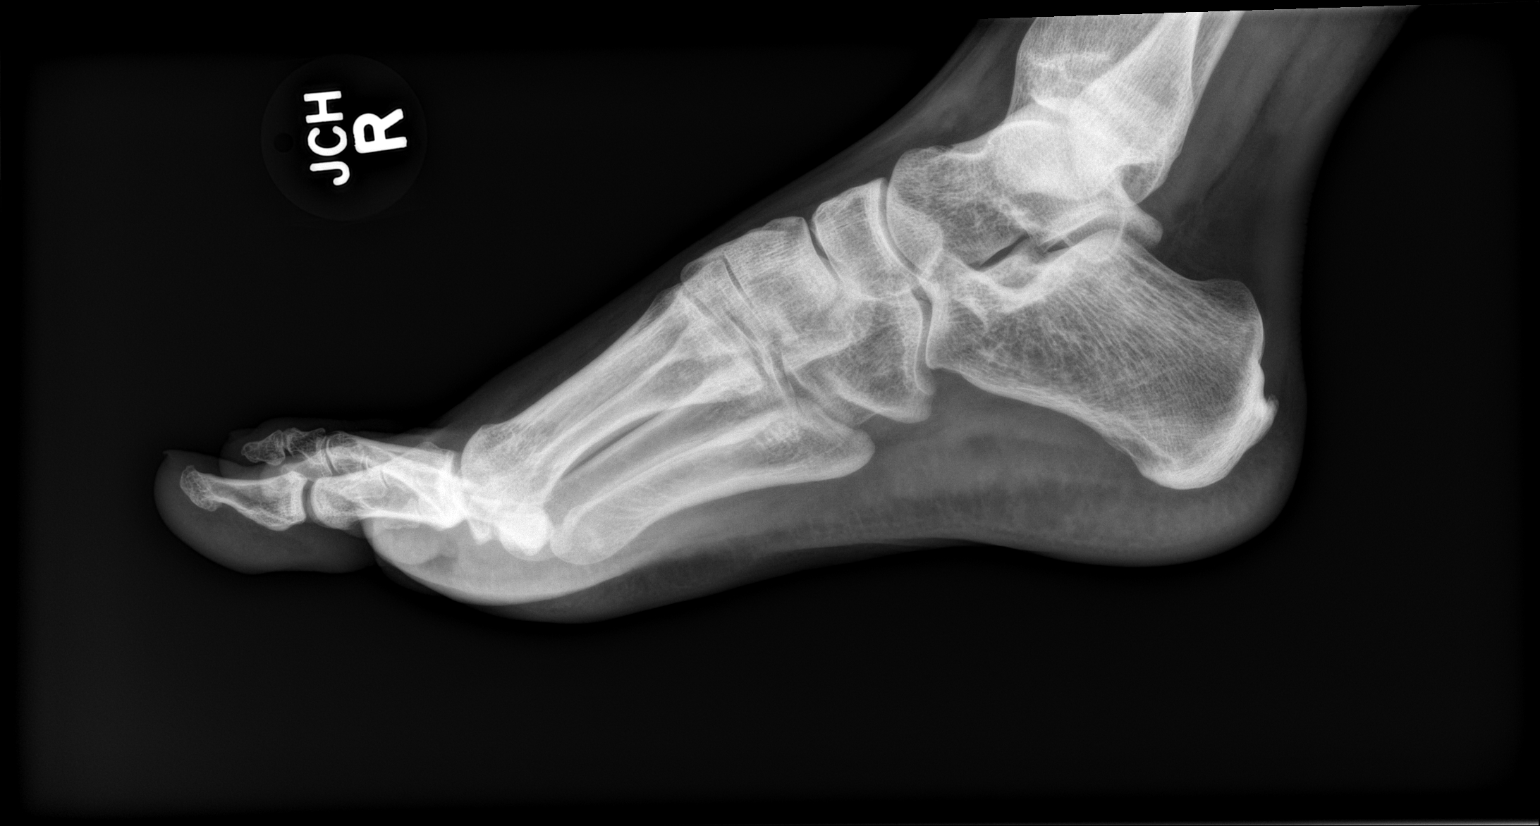

[3 of 3 positions shown; findings below may reference images not displayed]

FINDINGS: The bones of the right foot are adequately mineralized. There is no
acute or healing fracture. The interphalangeal joints and the
metatarsophalangeal joints are normal. The bones of the hindfoot are
unremarkable. The soft tissues are normal.
IMPRESSION: There is no acute bony abnormality of the right foot.

## 2015-12-27 ENCOUNTER — Ambulatory Visit: Payer: No Typology Code available for payment source | Admitting: Certified Nurse Midwife

## 2016-01-23 ENCOUNTER — Ambulatory Visit (INDEPENDENT_AMBULATORY_CARE_PROVIDER_SITE_OTHER): Payer: No Typology Code available for payment source | Admitting: Certified Nurse Midwife

## 2016-01-23 ENCOUNTER — Encounter: Payer: Self-pay | Admitting: Certified Nurse Midwife

## 2016-01-23 VITALS — BP 104/64 | HR 64 | Resp 16 | Ht 62.75 in | Wt 107.0 lb

## 2016-01-23 DIAGNOSIS — Z01419 Encounter for gynecological examination (general) (routine) without abnormal findings: Secondary | ICD-10-CM | POA: Diagnosis not present

## 2016-01-23 DIAGNOSIS — Z0000 Encounter for general adult medical examination without abnormal findings: Secondary | ICD-10-CM

## 2016-01-23 DIAGNOSIS — Z3049 Encounter for surveillance of other contraceptives: Secondary | ICD-10-CM | POA: Diagnosis not present

## 2016-01-23 DIAGNOSIS — R42 Dizziness and giddiness: Secondary | ICD-10-CM | POA: Diagnosis not present

## 2016-01-23 MED ORDER — ETONOGESTREL-ETHINYL ESTRADIOL 0.12-0.015 MG/24HR VA RING: 1.0000 | VAGINAL_RING | VAGINAL | 12 refills | Status: DC

## 2016-01-23 NOTE — Progress Notes (Signed)
30 y.o. G0P0000 Single  Caucasian Fe here for annual exam. Periods normal, no issues. Duration 3-4 days, no problems. Hepatitis C free now after [facility]. Feels the best she has been in a long time.  Not sexually active. Contraception Nuvaring, working well.  Patient has been experiencing dizziness for the past few days. Feels better when she lays down. Denies nausea or vomiting when occurs. Eating well. Has had eye exam, no glasses change. Occasional sinus issues in past. No other health issues today. Attending college now!!  Patient's last menstrual period was 01/15/2016 (exact date).          Sexually active: No.  The current method of family planning is NuvaRing vaginal inserts.    Exercising: Yes.    body building Smoker:  no  Health Maintenance: Pap:  12-26-14 neg MMG:  none Colonoscopy:  none BMD:   none TDaP:  About 2950yrs ago Shingles: not done Pneumonia: not done Hep C and HIV: HIV neg hep c positive 1a genotype 2016 Labs: declined Self breast exam: done occ.   reports that she has never smoked. She has never used smokeless tobacco. She reports that she does not drink alcohol or use drugs.  Past Medical History:  Diagnosis Date  . Hepatitis C antibody test positive 10/26/14   no treatment to date?  Marland Kitchen. History of abnormal Pap smear 2006   with LEEP  . Hyperthyroidism     Past Surgical History:  Procedure Laterality Date  . CERVICAL BIOPSY  W/ LOOP ELECTRODE EXCISION  2006  . COLPOSCOPY  2006    Current Outpatient Prescriptions  Medication Sig Dispense Refill  . cyclobenzaprine (FLEXERIL) 10 MG tablet Take 10 mg by mouth.    . etonogestrel-ethinyl estradiol (NUVARING) 0.12-0.015 MG/24HR vaginal ring Insert vaginally and leave in place for 3 consecutive weeks replace 1 each 12   No current facility-administered medications for this visit.     History reviewed. No pertinent family history.  ROS:  Pertinent items are noted in HPI.  Otherwise, a comprehensive  ROS was negative.  Exam:   BP 104/64   Pulse 64   Resp 16   Ht 5' 2.75" (1.594 m)   Wt 107 lb (48.5 kg)   LMP 01/15/2016 (Exact Date)   BMI 19.11 kg/m  Height: 5' 2.75" (159.4 cm) Ht Readings from Last 3 Encounters:  01/23/16 5' 2.75" (1.594 m)  12/26/14 5\' 3"  (1.6 m)  02/06/14 5\' 3"  (1.6 m)    General appearance: alert, cooperative and appears stated age Head: Normocephalic, without obvious abnormality, atraumatic Ears: bilateral slight membrane distention, wax noted in right ear, not occluded ear drum, non tender Neck: no adenopathy, supple, symmetrical, trachea midline and thyroid normal to inspection and palpation Lungs: clear to auscultation bilaterally Breasts: normal appearance, no masses or tenderness, No nipple retraction or dimpling, No nipple discharge or bleeding, No axillary or supraclavicular adenopathy Heart: regular rate and rhythm Abdomen: soft, non-tender; no masses,  no organomegaly Extremities: extremities normal, atraumatic, no cyanosis or edema Skin: Skin color, texture, turgor normal. No rashes or lesions Lymph nodes: Cervical, supraclavicular, and axillary nodes normal. No abnormal inguinal nodes palpated Neurologic: Grossly normal   Pelvic: External genitalia:  no lesions              Urethra:  normal appearing urethra with no masses, tenderness or lesions              Bartholin's and Skene's: normal  Vagina: normal appearing vagina with normal color and discharge, no lesions              Cervix: no cervical motion tenderness, no lesions and nulliparous appearance              Pap taken: Yes.   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 Cause of dizziness  R/O anemia  P:   Reviewed health and wellness pertinent to exam  Rx Nuvaring see order with instructions  Discussed decongestant use  OTC Mucinex to see if this resolves the occasional vertigo. Warning signs given of vertigo given and will need to be seen by PCP. Patient agreeable and feels better now. Discussed importance of good diet to avoid anemia, which can also cause dizziness. Questions addressed.  Lab: CBC, TSH  Pap smear as above not taken  Reviewed importance of good diet and calcium intake, SBE, STD prevention, risks and benefits/warning signs of Nuvaring, regular exercise.  return annually or prn  An After Visit Summary was printed and given to the patient.

## 2016-01-23 NOTE — Patient Instructions (Addendum)
EXERCISE AND DIET:  We recommended that you start or continue a regular exercise program for good health. Regular exercise means any activity that makes your heart beat faster and makes you sweat.  We recommend exercising at least 30 minutes per day at least 3 days a week, preferably 4 or 5.  We also recommend a diet low in fat and sugar.  Inactivity, poor dietary choices and obesity can cause diabetes, heart attack, stroke, and kidney damage, among others.    ALCOHOL AND SMOKING:  Women should limit their alcohol intake to no more than 7 drinks/beers/glasses of wine (combined, not each!) per week. Moderation of alcohol intake to this level decreases your risk of breast cancer and liver damage. And of course, no recreational drugs are part of a healthy lifestyle.  And absolutely no smoking or even second hand smoke. Most people know smoking can cause heart and lung diseases, but did you know it also contributes to weakening of your bones? Aging of your skin?  Yellowing of your teeth and nails?  CALCIUM AND VITAMIN D:  Adequate intake of calcium and Vitamin D are recommended.  The recommendations for exact amounts of these supplements seem to change often, but generally speaking 600 mg of calcium (either carbonate or citrate) and 800 units of Vitamin D per day seems prudent. Certain women may benefit from higher intake of Vitamin D.  If you are among these women, your doctor will have told you during your visit.    PAP SMEARS:  Pap smears, to check for cervical cancer or precancers,  have traditionally been done yearly, although recent scientific advances have shown that most women can have pap smears less often.  However, every woman still should have a physical exam from her gynecologist every year. It will include a breast check, inspection of the vulva and vagina to check for abnormal growths or skin changes, a visual exam of the cervix, and then an exam to evaluate the size and shape of the uterus and  ovaries.  And after 31 years of age, a rectal exam is indicated to check for rectal cancers. We will also provide age appropriate advice regarding health maintenance, like when you should have certain vaccines, screening for sexually transmitted diseases, bone density testing, colonoscopy, mammograms, etc.   MAMMOGRAMS:  All women over 40 years old should have a yearly mammogram. Many facilities now offer a "3D" mammogram, which may cost around $50 extra out of pocket. If possible,  we recommend you accept the option to have the 3D mammogram performed.  It both reduces the number of women who will be called back for extra views which then turn out to be normal, and it is better than the routine mammogram at detecting truly abnormal areas.    COLONOSCOPY:  Colonoscopy to screen for colon cancer is recommended for all women at age 50.  We know, you hate the idea of the prep.  We agree, BUT, having colon cancer and not knowing it is worse!!  Colon cancer so often starts as a polyp that can be seen and removed at colonscopy, which can quite literally save your life!  And if your first colonoscopy is normal and you have no family history of colon cancer, most women don't have to have it again for 10 years.  Once every ten years, you can do something that may end up saving your life, right?  We will be happy to help you get it scheduled when you are ready.    Be sure to check your insurance coverage so you understand how much it will cost.  It may be covered as a preventative service at no cost, but you should check your particular policy.      Dizziness Dizziness is a common problem. It is a feeling of unsteadiness or light-headedness. You may feel like you are about to faint. Dizziness can lead to injury if you stumble or fall. Anyone can become dizzy, but dizziness is more common in older adults. This condition can be caused by a number of things, including medicines, dehydration, or illness. HOME CARE  INSTRUCTIONS Taking these steps may help with your condition: Eating and Drinking  Drink enough fluid to keep your urine clear or pale yellow. This helps to keep you from becoming dehydrated. Try to drink more clear fluids, such as water.  Do not drink alcohol.  Limit your caffeine intake if directed by your health care provider.  Limit your salt intake if directed by your health care provider. Activity  Avoid making quick movements.  Rise slowly from chairs and steady yourself until you feel okay.  In the morning, first sit up on the side of the bed. When you feel okay, stand slowly while you hold onto something until you know that your balance is fine.  Move your legs often if you need to stand in one place for a long time. Tighten and relax your muscles in your legs while you are standing.  Do not drive or operate heavy machinery if you feel dizzy.  Avoid bending down if you feel dizzy. Place items in your home so that they are easy for you to reach without leaning over. Lifestyle  Do not use any tobacco products, including cigarettes, chewing tobacco, or electronic cigarettes. If you need help quitting, ask your health care provider.  Try to reduce your stress level, such as with yoga or meditation. Talk with your health care provider if you need help. General Instructions  Watch your dizziness for any changes.  Take medicines only as directed by your health care provider. Talk with your health care provider if you think that your dizziness is caused by a medicine that you are taking.  Tell a friend or a family member that you are feeling dizzy. If he or she notices any changes in your behavior, have this person call your health care provider.  Keep all follow-up visits as directed by your health care provider. This is important. SEEK MEDICAL CARE IF:  Your dizziness does not go away.  Your dizziness or light-headedness gets worse.  You feel nauseous.  You have  reduced hearing.  You have new symptoms.  You are unsteady on your feet or you feel like the room is spinning. SEEK IMMEDIATE MEDICAL CARE IF:  You vomit or have diarrhea and are unable to eat or drink anything.  You have problems talking, walking, swallowing, or using your arms, hands, or legs.  You feel generally weak.  You are not thinking clearly or you have trouble forming sentences. It may take a friend or family member to notice this.  You have chest pain, abdominal pain, shortness of breath, or sweating.  Your vision changes.  You notice any bleeding.  You have a headache.  You have neck pain or a stiff neck.  You have a fever.   This information is not intended to replace advice given to you by your health care provider. Make sure you discuss any questions you have with your  health care provider.   Document Released: 08/26/2000 Document Revised: 07/17/2014 Document Reviewed: 02/26/2014 Elsevier Interactive Patient Education [employer]2016 [employer]vier Inc.

## 2016-01-26 NOTE — Progress Notes (Signed)
Encounter reviewed Jill Jertson, MD   

## 2016-01-27 ENCOUNTER — Other Ambulatory Visit (INDEPENDENT_AMBULATORY_CARE_PROVIDER_SITE_OTHER): Payer: No Typology Code available for payment source

## 2016-01-27 DIAGNOSIS — Z0000 Encounter for general adult medical examination without abnormal findings: Secondary | ICD-10-CM

## 2016-01-27 LAB — CBC
HEMATOCRIT: 37.9 % (ref 35.0–45.0)
HEMOGLOBIN: 12.1 g/dL (ref 11.7–15.5)
MCH: 29.1 pg (ref 27.0–33.0)
MCHC: 31.9 g/dL — AB (ref 32.0–36.0)
MCV: 91.1 fL (ref 80.0–100.0)
MPV: 10.8 fL (ref 7.5–12.5)
Platelets: 241 10*3/uL (ref 140–400)
RBC: 4.16 MIL/uL (ref 3.80–5.10)
RDW: 12 % (ref 11.0–15.0)
WBC: 6.5 10*3/uL (ref 3.8–10.8)

## 2016-01-28 LAB — TSH: TSH: 0.78 mIU/L

## 2016-10-12 DIAGNOSIS — M7741 Metatarsalgia, right foot: Secondary | ICD-10-CM | POA: Insufficient documentation

## 2016-10-12 DIAGNOSIS — M722 Plantar fascial fibromatosis: Secondary | ICD-10-CM | POA: Insufficient documentation

## 2017-01-22 ENCOUNTER — Ambulatory Visit: Payer: Managed Care, Other (non HMO) | Admitting: Certified Nurse Midwife

## 2017-01-22 ENCOUNTER — Encounter: Payer: Self-pay | Admitting: Certified Nurse Midwife

## 2017-01-22 VITALS — BP 90/62 | HR 60 | Resp 16 | Ht 62.75 in | Wt 108.0 lb

## 2017-01-22 DIAGNOSIS — B9689 Other specified bacterial agents as the cause of diseases classified elsewhere: Secondary | ICD-10-CM

## 2017-01-22 DIAGNOSIS — N760 Acute vaginitis: Secondary | ICD-10-CM

## 2017-01-22 MED ORDER — METRONIDAZOLE 500 MG PO TABS: 500.0000 mg | ORAL_TABLET | Freq: Two times a day (BID) | ORAL | 0 refills | Status: DC

## 2017-01-22 NOTE — Progress Notes (Signed)
31 y.o. Single Caucasian female G0P0000 here with complaint of vaginal symptoms of burning, and small  bumps over the last 24 hours. Some  Increase in vaginal discharge, but no odor. Same partner, no change, no STD concerns. No history of mouth sores or herpes. Some fatigue and burning of the external vaginal area only. No UTI symptoms or pelvic pain. No personal problems. Patient having anxiety with the thought of Herpes. No exposure that she is aware of. No other health concerns today.  ROS Pertinent to above  O:Healthy female WDWN Affect: normal, orientation x 3  Exam: Thin WD female Skin: warm and dry Abdomen:Soft, non tender  Inguinal Lymph nodes: no enlargement or tenderness Pelvic exam: External genital: normal female, no lesion, bumps or extra skin noted . Mirror given to patient and she pointed to area she thought she a bump, but did not see anything with mirror and watched as area was examined. BUS: negative Vagina: scant watery discharge noted. Ph:5.0   ,Wet prep taken,, slight redness at introital area only, no lesions or herpetic blisters  noted Cervix: normal, non tender, no CMT, IUD string noted in cervix Uterus: normal, non tender Adnexa:normal, non tender, no masses or fullness noted   Wet Prep results: KOH, Saline + for clue cells, occas. WBC   A:Normal pelvic exam No indication of HSV BV   P:Discussed findings of BV and etiology. Discussed Aveeno or baking soda sitz bath for comfort. Discussed no evidence of herpes or other lesions or bumps, patient very reassured. Questions addressed. Rx: Flagyl see order with instructions  Rv prn

## 2017-01-22 NOTE — Patient Instructions (Signed)

## 2017-02-02 ENCOUNTER — Other Ambulatory Visit: Payer: Self-pay | Admitting: Certified Nurse Midwife

## 2017-02-02 DIAGNOSIS — Z3049 Encounter for surveillance of other contraceptives: Secondary | ICD-10-CM

## 2017-02-02 NOTE — Telephone Encounter (Signed)
Medication refill request: nuvaring  Last AEX:  01/23/16 DL Next AEX: tomorrow  Last MMG (if hormonal medication request): none Refill authorized: 01/23/16 #1each/12R. Today please advise.

## 2017-02-03 ENCOUNTER — Other Ambulatory Visit: Payer: Self-pay

## 2017-02-03 ENCOUNTER — Encounter: Payer: Self-pay | Admitting: Certified Nurse Midwife

## 2017-02-03 ENCOUNTER — Ambulatory Visit (INDEPENDENT_AMBULATORY_CARE_PROVIDER_SITE_OTHER): Payer: Managed Care, Other (non HMO) | Admitting: Certified Nurse Midwife

## 2017-02-03 VITALS — BP 100/62 | HR 68 | Resp 16 | Ht 63.25 in | Wt 106.0 lb

## 2017-02-03 DIAGNOSIS — Z3044 Encounter for surveillance of vaginal ring hormonal contraceptive device: Secondary | ICD-10-CM

## 2017-02-03 DIAGNOSIS — Z01419 Encounter for gynecological examination (general) (routine) without abnormal findings: Secondary | ICD-10-CM

## 2017-02-03 NOTE — Progress Notes (Signed)
31 y.o. G0P0000 Single  Caucasian Fe here for annual exam. Periods normal, no issues. Contraception Nuvaring working well. Finished Flagyl for BV, no symptoms now of irritation. Happy to be Hep C free. She still is participating in research project with the medication use. No STD concerns or screening need today. No other health issues today.  Patient's last menstrual period was 01/13/2017 (exact date).          Sexually active: Yes.    The current method of family planning is NuvaRing vaginal inserts.    Exercising: Yes.    gym & hiking Smoker:  no  Health Maintenance: Pap:  12-26-14 neg  History of Abnormal Pap: yes MMG:  none Self Breast exams: no Colonoscopy:  none BMD:   none TDaP:  2014 Shingles: no Pneumonia: no Hep C and HIV: 2016 Hep c reactive, genotype 1a Labs: yes   reports that  has never smoked. she has never used smokeless tobacco. She reports that she does not drink alcohol or use drugs.  Past Medical History:  Diagnosis Date  . Hepatitis C antibody test positive 10/26/14   no treatment to date?  Marland Kitchen. History of abnormal Pap smear 2006   with LEEP  . Hyperthyroidism     Past Surgical History:  Procedure Laterality Date  . CERVICAL BIOPSY  W/ LOOP ELECTRODE EXCISION  2006  . COLPOSCOPY  2006    Current Outpatient Medications  Medication Sig Dispense Refill  . NUVARING 0.12-0.015 MG/24HR vaginal ring Place 1 each vaginally every 28 (twenty-eight) days. Insert vaginally and leave in place for 3 consecutive weeks, then remove for 1 week. 1 each 0  . cyclobenzaprine (FLEXERIL) 10 MG tablet Take 10 mg by mouth.     No current facility-administered medications for this visit.     No family history on file.  ROS:  Pertinent items are noted in HPI.  Otherwise, a comprehensive ROS was negative.  Exam:   BP 100/62   Pulse 68   Resp 16   Ht 5' 3.25" (1.607 m)   Wt 106 lb (48.1 kg)   LMP 01/13/2017 (Exact Date)   BMI 18.63 kg/m  Height: 5' 3.25" (160.7 cm) Ht  Readings from Last 3 Encounters:  02/03/17 5' 3.25" (1.607 m)  01/22/17 5' 2.75" (1.594 m)  01/23/16 5' 2.75" (1.594 m)    General appearance: alert, cooperative and appears stated age Head: Normocephalic, without obvious abnormality, atraumatic Neck: no adenopathy, supple, symmetrical, trachea midline and thyroid normal to inspection and palpation Lungs: clear to auscultation bilaterally Breasts: normal appearance, no masses or tenderness, No nipple retraction or dimpling, No nipple discharge or bleeding Heart: regular rate and rhythm Abdomen: soft, non-tender; no masses,  no organomegaly Extremities: extremities normal, atraumatic, no cyanosis or edema Skin: Skin color, texture, turgor normal. No rashes or lesions Lymph nodes: Cervical, supraclavicular, and axillary nodes normal. No abnormal inguinal nodes palpated Neurologic: Grossly normal   Pelvic: External genitalia:  no lesions              Urethra:  normal appearing urethra with no masses, tenderness or lesions              Bartholin's and Skene's: normal                 Vagina: normal appearing vagina with normal color and discharge, no lesions              Cervix: no cervical motion tenderness, no lesions and nulliparous appearance  Pap taken: No. Bimanual Exam:  Uterus:  normal size, contour, position, consistency, mobility, non-tender              Adnexa: normal adnexa and no mass, fullness, tenderness               Rectovaginal: Confirms               Anus:  normal sphincter tone, no lesions  Chaperone present: yes  A:  Well Woman with normal exam  Contraception Nuvaring desires continuance  History of Hep.C now cured  P:   Reviewed health and wellness pertinent to exam  Discussed risks/benefits and warning signs of Nuvaring, desires continuance.  Rx Nuvaring see order with instructions  Continue follow up as indicated  Pap smear: no   counseled on breast self exam, STD prevention, HIV risk factors  and prevention, adequate intake of calcium and vitamin D, diet and exercise  return annually or prn  An After Visit Summary was printed and given to the patient.

## 2017-03-05 ENCOUNTER — Other Ambulatory Visit: Payer: Self-pay | Admitting: Certified Nurse Midwife

## 2017-03-05 DIAGNOSIS — Z3049 Encounter for surveillance of other contraceptives: Secondary | ICD-10-CM

## 2017-03-05 NOTE — Telephone Encounter (Signed)
Medication refill request: Nuvaring  Last AEX:  02/03/17 DL  Next AEX: 86/57/8411/22/19  Last MMG (if hormonal medication request): n/a Refill authorized: 02/02/17 #1, 0RF. Today, please advise.

## 2017-03-25 DIAGNOSIS — Z8619 Personal history of other infectious and parasitic diseases: Secondary | ICD-10-CM | POA: Insufficient documentation

## 2018-01-21 IMAGING — US US ABDOMEN COMPLETE W/ ELASTOGRAPHY
1 series · 12 of 12 positions shown · non-contrast
Comparison: None.

CLINICAL DATA: Chronic viral hepatitis B.



[Series 1: us abdomen complete w/ elastography · 0.12mm/px · 12 of 12 slices shown]
[im 1/12]
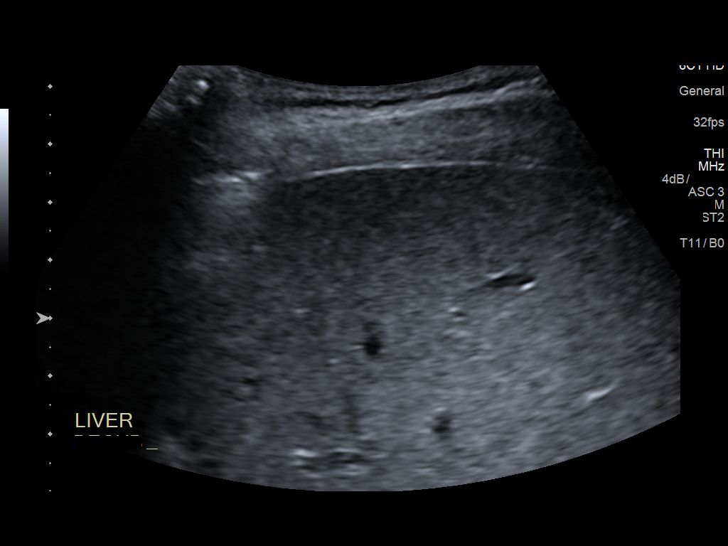
[im 2/12]
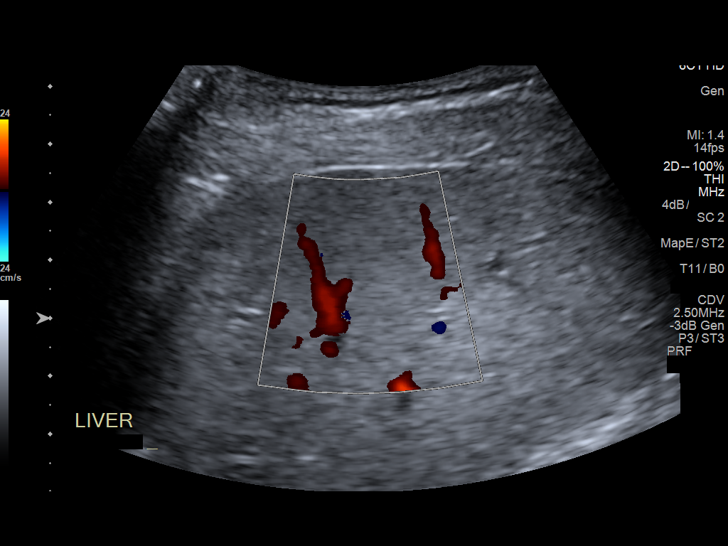
[im 3/12]
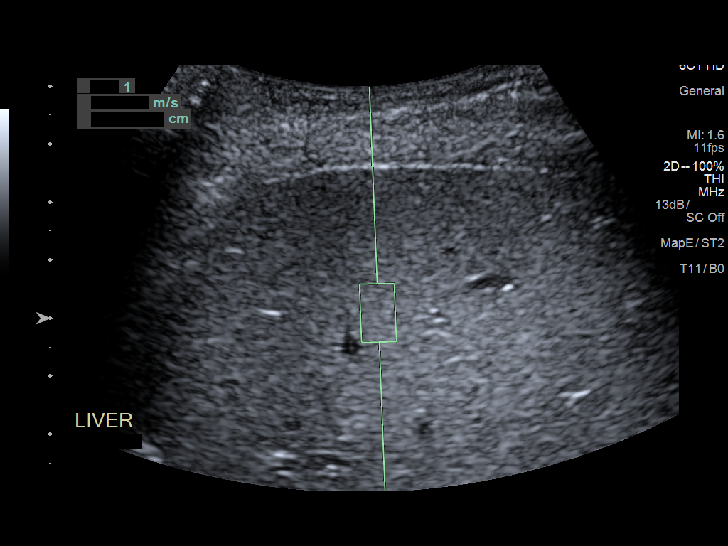
[im 4/12]
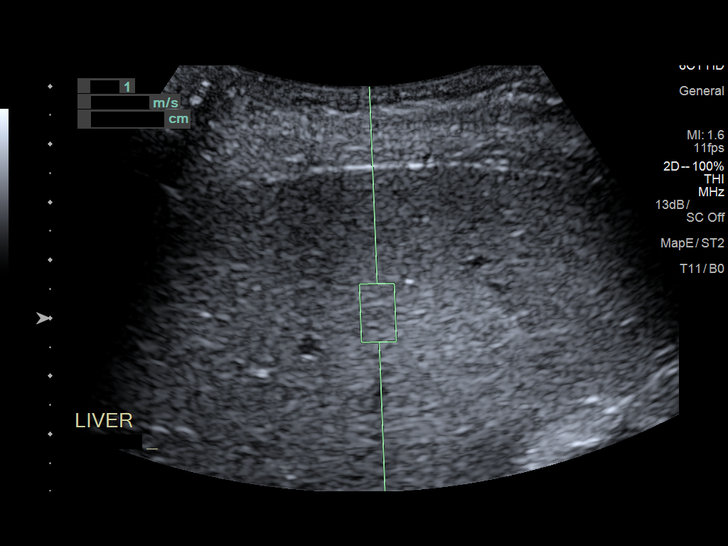
[im 5/12]
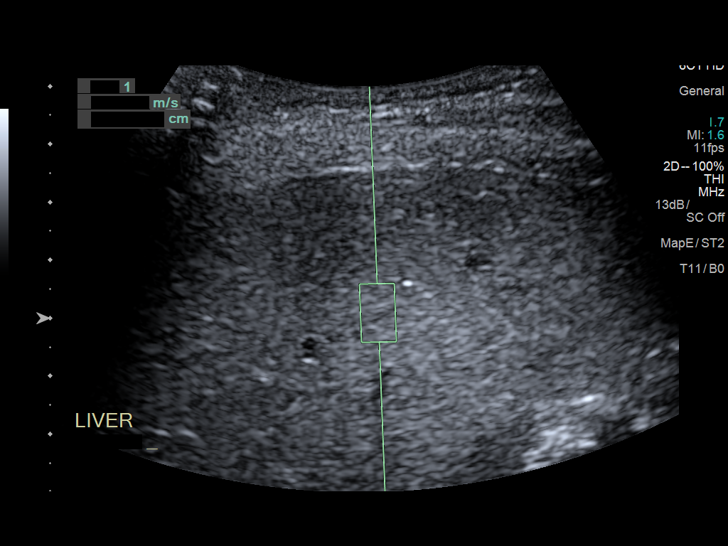
[im 6/12]
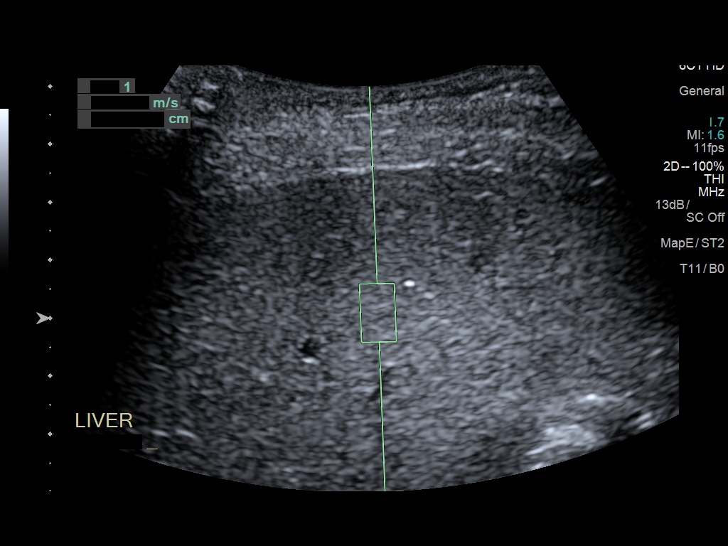
[im 7/12]
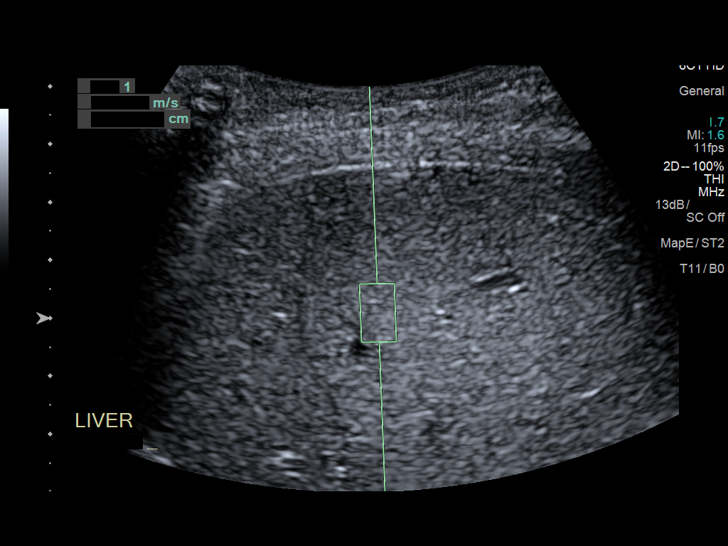
[im 8/12]
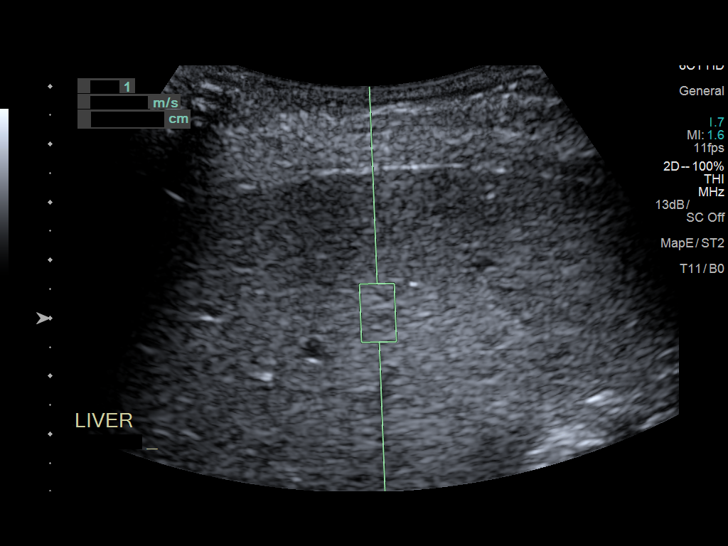
[im 9/12]
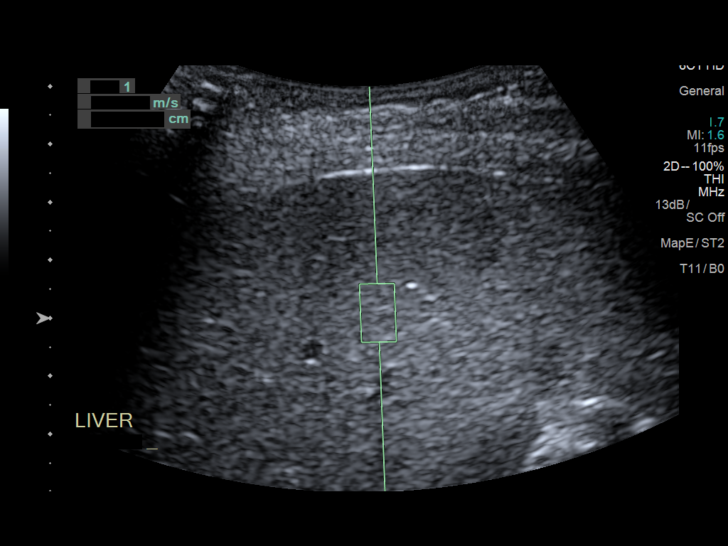
[im 10/12]
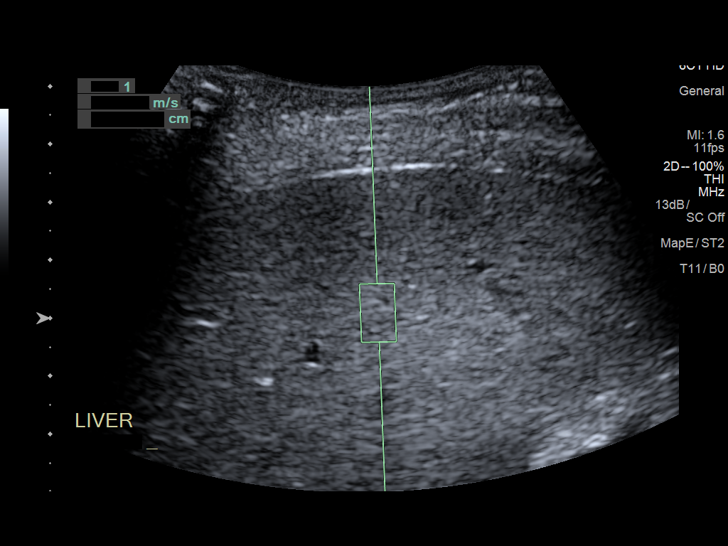
[im 11/12]
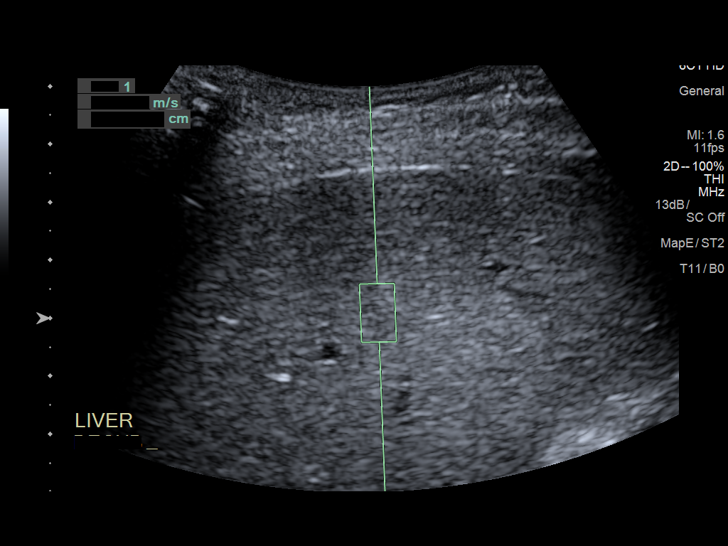
[im 12/12]
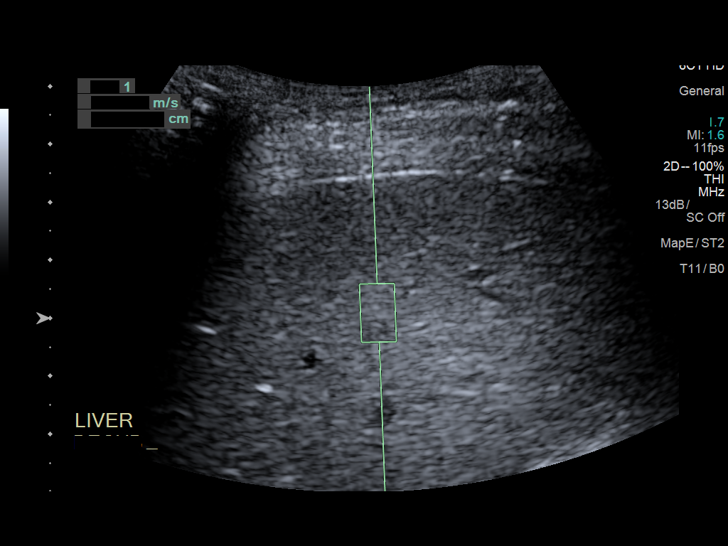

[12 of 12 positions shown; findings below may reference images not displayed]

FINDINGS: ULTRASOUND ABDOMEN

Gallbladder: No gallstones or wall thickening visualized. No
sonographic Murphy sign noted by sonographer.

Common bile duct: Diameter: 3.3 mm

Liver: No focal lesion identified. Within normal limits in
parenchymal echogenicity.

IVC: No abnormality visualized.

Pancreas: Visualized portion unremarkable.

Spleen: Size and appearance within normal limits.

Right Kidney: Length: 10.4 cm. Echogenicity within normal limits. No
mass or hydronephrosis visualized.

Left Kidney: Length: 10.9 cm. Echogenicity within normal limits. No
mass or hydronephrosis visualized.

Abdominal aorta: No aneurysm visualized.

Other findings: None.

ULTRASOUND HEPATIC ELASTOGRAPHY

Device: Siemens Helix VTQ

Patient position: Left lateral decubitus

Transducer 6 C1

Number of measurements:  10

Hepatic Segment:  8

Median velocity:   1.19  m/sec

IQR:

IQR/Median velocity ratio

Corresponding Metavir fibrosis score:  F2/F3

Risk of fibrosis: Moderate

Limitations of exam: None

Pertinent findings noted on other imaging exams:  None

Please note that abnormal shear wave velocities may also be
identified in clinical settings other than with hepatic fibrosis,
such as: acute hepatitis, elevated right heart and central venous
pressures including use of beta blockers, Given disease
(Sui), infiltrative processes such as
mastocytosis/amyloidosis/infiltrative tumor, extrahepatic
cholestasis, in the post-prandial state, and liver transplantation.
Correlation with patient history, laboratory data, and clinical
condition recommended.
IMPRESSION: 1. No acute findings.

Median hepatic shear wave velocity is calculated at 1.19 m/sec.

Corresponding Metavir fibrosis score is F2/F3.

Risk of fibrosis is moderate.

Follow-up:  Additional testing appropriate.

## 2018-02-04 ENCOUNTER — Encounter: Payer: Self-pay | Admitting: Certified Nurse Midwife

## 2018-02-04 ENCOUNTER — Ambulatory Visit: Payer: Managed Care, Other (non HMO) | Admitting: Certified Nurse Midwife

## 2018-02-04 ENCOUNTER — Other Ambulatory Visit (HOSPITAL_COMMUNITY)
Admission: RE | Admit: 2018-02-04 | Discharge: 2018-02-04 | Disposition: A | Discharge status: Home / Self Care | Payer: Managed Care, Other (non HMO) | Source: Ambulatory Visit | Attending: Certified Nurse Midwife | Admitting: Certified Nurse Midwife

## 2018-02-04 ENCOUNTER — Other Ambulatory Visit: Payer: Self-pay

## 2018-02-04 VITALS — BP 102/64 | HR 68 | Resp 16 | Ht 63.25 in | Wt 108.0 lb

## 2018-02-04 DIAGNOSIS — Z124 Encounter for screening for malignant neoplasm of cervix: Secondary | ICD-10-CM | POA: Diagnosis not present

## 2018-02-04 DIAGNOSIS — Z113 Encounter for screening for infections with a predominantly sexual mode of transmission: Secondary | ICD-10-CM | POA: Diagnosis not present

## 2018-02-04 DIAGNOSIS — Z3049 Encounter for surveillance of other contraceptives: Secondary | ICD-10-CM

## 2018-02-04 DIAGNOSIS — Z01419 Encounter for gynecological examination (general) (routine) without abnormal findings: Secondary | ICD-10-CM | POA: Diagnosis not present

## 2018-02-04 MED ORDER — ETONOGESTREL-ETHINYL ESTRADIOL 0.12-0.015 MG/24HR VA RING
VAGINAL_RING | VAGINAL | 12 refills | Status: DC
Start: 2018-02-04 — End: 2019-02-06

## 2018-02-04 NOTE — Patient Instructions (Signed)
General topics  Next pap or exam is  due in 1 year Take a Women's multivitamin Take 1200 mg. of calcium daily - prefer dietary If any concerns in interim to call back  Breast Self-Awareness Practicing breast self-awareness may pick up problems early, prevent significant medical complications, and possibly save your life. By practicing breast self-awareness, you can become familiar with how your breasts look and feel and if your breasts are changing. This allows you to notice changes early. It can also offer you some reassurance that your breast health is good. One way to learn what is normal for your breasts and whether your breasts are changing is to do a breast self-exam. If you find a lump or something that was not present in the past, it is best to contact your caregiver right away. Other findings that should be evaluated by your caregiver include nipple discharge, especially if it is bloody; skin changes or reddening; areas where the skin seems to be pulled in (retracted); or new lumps and bumps. Breast pain is seldom associated with cancer (malignancy), but should also be evaluated by a caregiver. BREAST SELF-EXAM The best time to examine your breasts is 5 7 days after your menstrual period is over.  ExitCare Patient Information 2013 [employer].   Exercise to Stay Healthy Exercise helps you become and stay healthy. EXERCISE IDEAS AND TIPS Choose exercises that:  You enjoy.  Fit into your day. You do not need to exercise really hard to be healthy. You can do exercises at a slow or medium level and stay healthy. You can:  Stretch before and after working out.  Try yoga, Pilates, or tai chi.  Lift weights.  Walk fast, swim, jog, run, climb stairs, bicycle, dance, or rollerskate.  Take aerobic classes. Exercises that burn about 150 calories:  Running 1  miles in 15 minutes.  Playing volleyball for 45 to 60 minutes.  Washing and waxing a car for 45 to 60  minutes.  Playing touch football for 45 minutes.  Walking 1  miles in 35 minutes.  Pushing a stroller 1  miles in 30 minutes.  Playing basketball for 30 minutes.  Raking leaves for 30 minutes.  Bicycling 5 miles in 30 minutes.  Walking 2 miles in 30 minutes.  Dancing for 30 minutes.  Shoveling snow for 15 minutes.  Swimming laps for 20 minutes.  Walking up stairs for 15 minutes.  Bicycling 4 miles in 15 minutes.  Gardening for 30 to 45 minutes.  Jumping rope for 15 minutes.  Washing windows or floors for 45 to 60 minutes. Document Released: 04/04/2010 Document Revised: 05/25/2011 Document Reviewed: 04/04/2010 ExitCare Patient Information 2013 [employer].   Other topics ( that may be useful information):    Sexually Transmitted Disease Sexually transmitted disease (STD) refers to any infection that is passed from person to person during sexual activity. This may happen by way of saliva, semen, blood, vaginal mucus, or urine. Common STDs include:  Gonorrhea.  Chlamydia.  Syphilis.  HIV/AIDS.  Genital herpes.  Hepatitis B and C.  Trichomonas.  Human papillomavirus (HPV).  Pubic lice. CAUSES  An STD may be spread by bacteria, virus, or parasite. A person can get an STD by:  Sexual intercourse with an infected person.  Sharing sex toys with an infected person.  Sharing needles with an infected person.  Having intimate contact with the genitals, mouth, or rectal areas of an infected person. SYMPTOMS  Some people may not have any symptoms, but   they can still pass the infection to others. Different STDs have different symptoms. Symptoms include:  Painful or bloody urination.  Pain in the pelvis, abdomen, vagina, anus, throat, or eyes.  Skin rash, itching, irritation, growths, or sores (lesions). These usually occur in the genital or anal area.  Abnormal vaginal discharge.  Penile discharge in men.  Soft, flesh-colored skin growths in the  genital or anal area.  Fever.  Pain or bleeding during sexual intercourse.  Swollen glands in the groin area.  Yellow skin and eyes (jaundice). This is seen with hepatitis. DIAGNOSIS  To make a diagnosis, your caregiver may:  Take a medical history.  Perform a physical exam.  Take a specimen (culture) to be examined.  Examine a sample of discharge under a microscope.  Perform blood test TREATMENT   Chlamydia, gonorrhea, trichomonas, and syphilis can be cured with antibiotic medicine.  Genital herpes, hepatitis, and HIV can be treated, but not cured, with prescribed medicines. The medicines will lessen the symptoms.  Genital warts from HPV can be treated with medicine or by freezing, burning (electrocautery), or surgery. Warts may come back.  HPV is a virus and cannot be cured with medicine or surgery.However, abnormal areas may be followed very closely by your caregiver and may be removed from the cervix, vagina, or vulva through office procedures or surgery. If your diagnosis is confirmed, your recent sexual partners need treatment. This is true even if they are symptom-free or have a negative culture or evaluation. They should not have sex until their caregiver says it is okay. HOME CARE INSTRUCTIONS  All sexual partners should be informed, tested, and treated for all STDs.  Take your antibiotics as directed. Finish them even if you start to feel better.  Only take over-the-counter or prescription medicines for pain, discomfort, or fever as directed by your caregiver.  Rest.  Eat a balanced diet and drink enough fluids to keep your urine clear or pale yellow.  Do not have sex until treatment is completed and you have followed up with your caregiver. STDs should be checked after treatment.  Keep all follow-up appointments, Pap tests, and blood tests as directed by your caregiver.  Only use latex condoms and water-soluble lubricants during sexual activity. Do not use  petroleum jelly or oils.  Avoid alcohol and illegal drugs.  Get vaccinated for HPV and hepatitis. If you have not received these vaccines in the past, talk to your caregiver about whether one or both might be right for you.  Avoid risky sex practices that can break the skin. The only way to avoid getting an STD is to avoid all sexual activity.Latex condoms and dental dams (for oral sex) will help lessen the risk of getting an STD, but will not completely eliminate the risk. SEEK MEDICAL CARE IF:   You have a fever.  You have any new or worsening symptoms. Document Released: 05/23/2002 Document Revised: 05/25/2011 Document Reviewed: 05/30/2010 [facility] Patient Information 2013 [employer].    Domestic Abuse You are being battered or abused if someone close to you hits, pushes, or physically hurts you in any way. You also are being abused if you are forced into activities. You are being sexually abused if you are forced to have sexual contact of any kind. You are being emotionally abused if you are made to feel worthless or if you are constantly threatened. It is important to remember that help is available. No one has the right to abuse you. PREVENTION OF FURTHER  ABUSE  Learn the warning signs of danger. This varies with situations but may include: the use of alcohol, threats, isolation from friends and family, or forced sexual contact. Leave if you feel that violence is going to occur.  If you are attacked or beaten, report it to the police so the abuse is documented. You do not have to press charges. The police can protect you while you or the attackers are leaving. Get the officer's name and badge number and a copy of the report.  Find someone you can trust and tell them what is happening to you: your caregiver, a nurse, clergy member, close friend or family member. Feeling ashamed is natural, but remember that you have done nothing wrong. No one deserves abuse. Document Released:  02/28/2000 Document Revised: 05/25/2011 Document Reviewed: 05/08/2010 ExitCare Patient Information 2013 [employer].    How Much is Too Much Alcohol? Drinking too much alcohol can cause injury, accidents, and health problems. These types of problems can include:   Car crashes.  Falls.  Family fighting (domestic violence).  Drowning.  Fights.  Injuries.  Burns.  Damage to certain organs.  Having a baby with birth defects. ONE DRINK CAN BE TOO MUCH WHEN YOU ARE:  Working.  Pregnant or breastfeeding.  Taking medicines. Ask your doctor.  Driving or planning to drive. If you or someone you know has a drinking problem, get help from a doctor.  Document Released: 12/27/2008 Document Revised: 05/25/2011 Document Reviewed: 12/27/2008 ExitCare Patient Information 2013 [employer].   Smoking Hazards Smoking cigarettes is extremely bad for your health. Tobacco smoke has over 200 known poisons in it. There are over 60 chemicals in tobacco smoke that cause cancer. Some of the chemicals found in cigarette smoke include:   Cyanide.  Benzene.  Formaldehyde.  Methanol (wood alcohol).  Acetylene (fuel used in welding torches).  Ammonia. Cigarette smoke also contains the poisonous gases nitrogen oxide and carbon monoxide.  Cigarette smokers have an increased risk of many serious medical problems and Smoking causes approximately:  90% of all lung cancer deaths in men.  80% of all lung cancer deaths in women.  90% of deaths from chronic obstructive lung disease. Compared with nonsmokers, smoking increases the risk of:  Coronary heart disease by 2 to 4 times.  Stroke by 2 to 4 times.  Men developing lung cancer by 23 times.  Women developing lung cancer by 13 times.  Dying from chronic obstructive lung diseases by 12 times.  . Smoking is the most preventable cause of death and disease in our society.  WHY IS SMOKING ADDICTIVE?  Nicotine is the chemical  agent in tobacco that is capable of causing addiction or dependence.  When you smoke and inhale, nicotine is absorbed rapidly into the bloodstream through your lungs. Nicotine absorbed through the lungs is capable of creating a powerful addiction. Both inhaled and non-inhaled nicotine may be addictive.  Addiction studies of cigarettes and spit tobacco show that addiction to nicotine occurs mainly during the teen years, when young people begin using tobacco products. WHAT ARE THE BENEFITS OF QUITTING?  There are many health benefits to quitting smoking.   Likelihood of developing cancer and heart disease decreases. Health improvements are seen almost immediately.  Blood pressure, pulse rate, and breathing patterns start returning to normal soon after quitting. QUITTING SMOKING   American Lung Association - 1-800-LUNGUSA  American Cancer Society - 1-800-ACS-2345 Document Released: 04/09/2004 Document Revised: 05/25/2011 Document Reviewed: 12/12/2008 ExitCare Patient Information 2013 [employer]   LL[employer].   Stress Management Stress is a state of physical or mental tension that often results from changes in your life or normal routine. Some common causes of stress are:  Death of a loved one.  Injuries or severe illnesses.  Getting fired or changing jobs.  Moving into a new home. Other causes may be:  Sexual problems.  Business or financial losses.  Taking on a large debt.  Regular conflict with someone at home or at work.  Constant tiredness from lack of sleep. It is not just bad things that are stressful. It may be stressful to:  Win the lottery.  Get married.  Buy a new car. The amount of stress that can be easily tolerated varies from person to person. Changes generally cause stress, regardless of the types of change. Too much stress can affect your health. It may lead to physical or emotional problems. Too little stress (boredom) may also become stressful. SUGGESTIONS TO  REDUCE STRESS:  Talk things over with your family and friends. It often is helpful to share your concerns and worries. If you feel your problem is serious, you may want to get help from a professional counselor.  Consider your problems one at a time instead of lumping them all together. Trying to take care of everything at once may seem impossible. List all the things you need to do and then start with the most important one. Set a goal to accomplish 2 or 3 things each day. If you expect to do too many in a single day you will naturally fail, causing you to feel even more stressed.  Do not use alcohol or drugs to relieve stress. Although you may feel better for a short time, they do not remove the problems that caused the stress. They can also be habit forming.  Exercise regularly - at least 3 times per week. Physical exercise can help to relieve that "uptight" feeling and will relax you.  The shortest distance between despair and hope is often a good night's sleep.  Go to bed and get up on time allowing yourself time for appointments without being rushed.  Take a short "time-out" period from any stressful situation that occurs during the day. Close your eyes and take some deep breaths. Starting with the muscles in your face, tense them, hold it for a few seconds, then relax. Repeat this with the muscles in your neck, shoulders, hand, stomach, back and legs.  Take good care of yourself. Eat a balanced diet and get plenty of rest.  Schedule time for having fun. Take a break from your daily routine to relax. HOME CARE INSTRUCTIONS   Call if you feel overwhelmed by your problems and feel you can no longer manage them on your own.  Return immediately if you feel like hurting yourself or someone else. Document Released: 08/26/2000 Document Revised: 05/25/2011 Document Reviewed: 04/18/2007 ExitCare Patient Information 2013 [employer].   

## 2018-02-04 NOTE — Progress Notes (Signed)
32 y.o. G0P0000 Single  Caucasian Fe here for annual exam. Periods normal, no issues, except for PMS and cramping with periods, no issues. Contraception Nuvaring working well. Also noted area of ? Fat loss in skin from injection, please check. Has finally ended relationship with partner of 7 years and feels so good about the decision. Desires STD screening today. Has not needed follow up in Hep C clinic at [facility] after last check with treatment. No other health issues today.  Patient's last menstrual period was 01/10/2018 (exact date).          Sexually active: Yes.    The current method of family planning is NuvaRing vaginal inserts.    Exercising: Yes.    weights Smoker:  no  Review of Systems  Constitutional: Negative.   HENT: Negative.   Eyes: Negative.   Respiratory: Negative.   Cardiovascular: Negative.   Gastrointestinal: Negative.   Genitourinary:       Loss of sexual interest  Musculoskeletal: Negative.   Skin: Positive for itching.  Neurological: Negative.   Endo/Heme/Allergies: Negative.   Psychiatric/Behavioral: Negative.     Health Maintenance: Pap:  12-26-14 neg History of Abnormal Pap: yes LEEP MMG:  none Self Breast exams: no Colonoscopy:  none BMD:   none TDaP:  2014 Shingles: no Pneumonia: no Hep C and HIV: 2016 hep c reactive, genotype 1a per pt had treatment 2017 Labs: if needed.   reports that she has never smoked. She has never used smokeless tobacco. She reports that she drinks about 1.0 standard drinks of alcohol per week. She reports that she does not use drugs.  Past Medical History:  Diagnosis Date  . Hepatitis C antibody test positive 10/26/14   no treatment to date?  Marland Kitchen History of abnormal Pap smear 2006   with LEEP  . Hyperthyroidism     Past Surgical History:  Procedure Laterality Date  . CERVICAL BIOPSY  W/ LOOP ELECTRODE EXCISION  2006  . COLPOSCOPY  2006    Current Outpatient Medications  Medication Sig Dispense Refill  . NUVARING  0.12-0.015 MG/24HR vaginal ring Insert vaginally and leave in place FOR 3 consecutive WEEKS, THEN REMOVE FOR ONE WEEK.] 1 each 12  . cyclobenzaprine (FLEXERIL) 10 MG tablet Take 10 mg by mouth.     No current facility-administered medications for this visit.     History reviewed. No pertinent family history.  ROS:  Pertinent items are noted in HPI.  Otherwise, a comprehensive ROS was negative.  Exam:   BP 102/64   Pulse 68   Resp 16   Ht 5' 3.25" (1.607 m)   Wt 108 lb (49 kg)   LMP 01/10/2018 (Exact Date)   BMI 18.98 kg/m  Height: 5' 3.25" (160.7 cm) Ht Readings from Last 3 Encounters:  02/04/18 5' 3.25" (1.607 m)  02/03/17 5' 3.25" (1.607 m)  01/22/17 5' 2.75" (1.594 m)    General appearance: alert, cooperative and appears stated age Head: Normocephalic, without obvious abnormality, atraumatic Neck: no adenopathy, supple, symmetrical, trachea midline and thyroid normal to inspection and palpation Lungs: clear to auscultation bilaterally Breasts: normal appearance, no masses or tenderness, No nipple retraction or dimpling, No nipple discharge or bleeding, No axillary or supraclavicular adenopathy Heart: regular rate and rhythm Abdomen: soft, non-tender; no masses,  no organomegaly Extremities: extremities normal, atraumatic, no cyanosis or edema,  Left hip small indenture in fat area of buttock injection site area, no redness or skin wasting noted Skin: Skin color, texture, turgor normal.  No rashes or lesions Lymph nodes: Cervical, supraclavicular, and axillary nodes normal. No abnormal inguinal nodes palpated Neurologic: Grossly normal   Pelvic: External genitalia:  no lesions              Urethra:  normal appearing urethra with no masses, tenderness or lesions              Bartholin's and Skene's: normal                 Vagina: normal appearing vagina with normal color and discharge, no lesions              Cervix: no cervical motion tenderness, no lesions and nulliparous  appearance              Pap taken: Yes.   Bimanual Exam:  Uterus:  normal size, contour, position, consistency, mobility, non-tender and anteverted              Adnexa: normal adnexa and no mass, fullness, tenderness               Rectovaginal: Confirms               Anus:  normal sphincter tone, no lesions  Chaperone present: yes  A:  Well Woman with normal exam  Contraception Nuvaring desired  History of Hepatitis C treated with negative follow up  Fat wasting? From injection site of steroid  STD screening    P:   Reviewed health and wellness pertinent to exam  Risks/benefits/warning signs of Nuvaring reviewed.   Rx Nuvaring see order with instructions  Discussed finding and appearance with injection site change. Usually will fill in with time. She may want to see her MD regarding.  Labs: STD panel, Gc/chlamydia, Affirm  Pap smear: yes   counseled on breast self exam, STD prevention, HIV risk factors and prevention, adequate intake of calcium and vitamin D, diet and exercise  return annually or prn  An After Visit Summary was printed and given to the patient.

## 2018-02-05 LAB — HEP, RPR, HIV PANEL
HEP B S AG: NEGATIVE
HIV SCREEN 4TH GENERATION: NONREACTIVE
RPR Ser Ql: NONREACTIVE

## 2018-02-05 LAB — VAGINITIS/VAGINOSIS, DNA PROBE
Candida Species: NEGATIVE
GARDNERELLA VAGINALIS: NEGATIVE
TRICHOMONAS VAG: NEGATIVE

## 2018-02-06 LAB — GC/CHLAMYDIA PROBE AMP
CHLAMYDIA, DNA PROBE: NEGATIVE
Neisseria gonorrhoeae by PCR: NEGATIVE

## 2018-02-08 LAB — CYTOLOGY - PAP
Diagnosis: NEGATIVE
HPV (WINDOPATH): NOT DETECTED

## 2019-02-03 ENCOUNTER — Other Ambulatory Visit: Payer: Self-pay | Admitting: Certified Nurse Midwife

## 2019-02-03 DIAGNOSIS — Z3049 Encounter for surveillance of other contraceptives: Secondary | ICD-10-CM

## 2019-02-06 ENCOUNTER — Other Ambulatory Visit: Payer: Self-pay | Admitting: Certified Nurse Midwife

## 2019-02-06 DIAGNOSIS — Z3049 Encounter for surveillance of other contraceptives: Secondary | ICD-10-CM

## 2019-02-06 MED ORDER — ETONOGESTREL-ETHINYL ESTRADIOL 0.12-0.015 MG/24HR VA RING
VAGINAL_RING | VAGINAL | 0 refills | Status: DC
Start: 2019-02-06 — End: 2019-03-03

## 2019-02-06 NOTE — Telephone Encounter (Signed)
Med refill request: Nuvaring Last AEX: 02/04/2018 Next AEX:03/06/2019 Last MMG (if hormonal med) n/a Refill authorized: 1 ring with 0RF, orders pended if approved.

## 2019-02-06 NOTE — Telephone Encounter (Signed)
Patient is requesting a refill of her birth control. She did scheduled her aex 03/08/19. Please see denied rx. request

## 2019-02-15 ENCOUNTER — Ambulatory Visit: Payer: Managed Care, Other (non HMO) | Admitting: Certified Nurse Midwife

## 2019-03-02 ENCOUNTER — Other Ambulatory Visit: Payer: Self-pay | Admitting: Certified Nurse Midwife

## 2019-03-02 DIAGNOSIS — Z3049 Encounter for surveillance of other contraceptives: Secondary | ICD-10-CM

## 2019-03-02 NOTE — Telephone Encounter (Signed)
Medication refill request: Nuvaring  Last AEX:  02-04-18 DL  Next AEX: 03-22-19 Last MMG (if hormonal medication request): n/a Refill authorized: Today, please advise.   Medication pended for #1, 0RF. Please refill if appropriate.

## 2019-03-06 ENCOUNTER — Ambulatory Visit: Payer: Managed Care, Other (non HMO) | Admitting: Certified Nurse Midwife

## 2019-03-20 ENCOUNTER — Other Ambulatory Visit: Payer: Self-pay

## 2019-03-22 ENCOUNTER — Encounter: Payer: Self-pay | Admitting: Certified Nurse Midwife

## 2019-03-22 ENCOUNTER — Ambulatory Visit: Payer: Managed Care, Other (non HMO) | Admitting: Certified Nurse Midwife

## 2019-03-22 ENCOUNTER — Other Ambulatory Visit: Payer: Self-pay

## 2019-03-22 VITALS — BP 114/70 | HR 68 | Temp 97.1°F | Resp 16 | Ht 63.0 in | Wt 114.0 lb

## 2019-03-22 DIAGNOSIS — Z3049 Encounter for surveillance of other contraceptives: Secondary | ICD-10-CM

## 2019-03-22 DIAGNOSIS — Z113 Encounter for screening for infections with a predominantly sexual mode of transmission: Secondary | ICD-10-CM

## 2019-03-22 DIAGNOSIS — Z01419 Encounter for gynecological examination (general) (routine) without abnormal findings: Secondary | ICD-10-CM | POA: Diagnosis not present

## 2019-03-22 MED ORDER — ETONOGESTREL-ETHINYL ESTRADIOL 0.12-0.015 MG/24HR VA RING: VAGINAL_RING | VAGINAL | 4 refills | Status: AC

## 2019-03-22 NOTE — Patient Instructions (Signed)
.pat  EXERCISE AND DIET:  We recommended that you start or continue a regular exercise program for good health. Regular exercise means any activity that makes your heart beat faster and makes you sweat.  We recommend exercising at least 30 minutes per day at least 3 days a week, preferably 4 or 5.  We also recommend a diet low in fat and sugar.  Inactivity, poor dietary choices and obesity can cause diabetes, heart attack, stroke, and kidney damage, among others.    ALCOHOL AND SMOKING:  Women should limit their alcohol intake to no more than 7 drinks/beers/glasses of wine (combined, not each!) per week. Moderation of alcohol intake to this level decreases your risk of breast cancer and liver damage. And of course, no recreational drugs are part of a healthy lifestyle.  And absolutely no smoking or even second hand smoke. Most people know smoking can cause heart and lung diseases, but did you know it also contributes to weakening of your bones? Aging of your skin?  Yellowing of your teeth and nails?  CALCIUM AND VITAMIN D:  Adequate intake of calcium and Vitamin D are recommended.  The recommendations for exact amounts of these supplements seem to change often, but generally speaking 600 mg of calcium (either carbonate or citrate) and 800 units of Vitamin D per day seems prudent. Certain women may benefit from higher intake of Vitamin D.  If you are among these women, your doctor will have told you during your visit.    PAP SMEARS:  Pap smears, to check for cervical cancer or precancers,  have traditionally been done yearly, although recent scientific advances have shown that most women can have pap smears less often.  However, every woman still should have a physical exam from her gynecologist every year. It will include a breast check, inspection of the vulva and vagina to check for abnormal growths or skin changes, a visual exam of the cervix, and then an exam to evaluate the size and shape of the uterus  and ovaries.  And after 35 years of age, a rectal exam is indicated to check for rectal cancers. We will also provide age appropriate advice regarding health maintenance, like when you should have certain vaccines, screening for sexually transmitted diseases, bone density testing, colonoscopy, mammograms, etc.   MAMMOGRAMS:  All women over 63 years old should have a yearly mammogram. Many facilities now offer a "3D" mammogram, which may cost around $50 extra out of pocket. If possible,  we recommend you accept the option to have the 3D mammogram performed.  It both reduces the number of women who will be called back for extra views which then turn out to be normal, and it is better than the routine mammogram at detecting truly abnormal areas.    COLONOSCOPY:  Colonoscopy to screen for colon cancer is recommended for all women at age 47.  We know, you hate the idea of the prep.  We agree, BUT, having colon cancer and not knowing it is worse!!  Colon cancer so often starts as a polyp that can be seen and removed at colonscopy, which can quite literally save your life!  And if your first colonoscopy is normal and you have no family history of colon cancer, most women don't have to have it again for 10 years.  Once every ten years, you can do something that may end up saving your life, right?  We will be happy to help you get it scheduled when you  are ready.  Be sure to check your insurance coverage so you understand how much it will cost.  It may be covered as a preventative service at no cost, but you should check your particular policy.

## 2019-03-22 NOTE — Progress Notes (Signed)
34 y.o. G0P0000 Single  Caucasian Fe here for annual exam. Periods normal, duration 3 days, cramping no pain medication use. No missed periods. Nuvaring working well, desires continuance. Not sexually active in last 6 months. No treatment needed for Hep.C. Requests STD screening today. Has moved to [place] and bought her first house! No health issues today.   No LMP recorded.          Sexually active: No.  The current method of family planning is NuvaRing vaginal inserts.    Exercising: Yes.    twice a week Smoker:  no  Review of Systems  Constitutional: Negative.   HENT: Negative.   Eyes: Negative.   Respiratory: Negative.   Cardiovascular: Negative.   Gastrointestinal: Negative.   Genitourinary: Negative.   Musculoskeletal: Negative.   Skin: Negative.   Neurological: Negative.   Endo/Heme/Allergies: Negative.   Psychiatric/Behavioral: Negative.     Health Maintenance: Pap:  12-26-14 neg, 02-04-18 neg HPV HR neg History of Abnormal Pap: LEEP 2006 MMG:  none Self Breast exams: no Colonoscopy: none BMD:  none TDaP:  2014 Shingles: no Pneumonia: no Hep C and HIV: HIV neg 2019, 2016 hep c reative, genotype 1a, per patient had treatment 2017 Labs: yes   reports that she has never smoked. She has never used smokeless tobacco. She reports current alcohol use of about 1.0 standard drinks of alcohol per week. She reports that she does not use drugs.  Past Medical History:  Diagnosis Date  . Hepatitis C antibody test positive 10/26/14   no treatment to date?  Marland Kitchen History of abnormal Pap smear 2006   with LEEP  . Hyperthyroidism     Past Surgical History:  Procedure Laterality Date  . CERVICAL BIOPSY  W/ LOOP ELECTRODE EXCISION  2006  . COLPOSCOPY  2006    Current Outpatient Medications  Medication Sig Dispense Refill  . cyclobenzaprine (FLEXERIL) 10 MG tablet Take 10 mg by mouth.    . etonogestrel-ethinyl estradiol (NUVARING) 0.12-0.015 MG/24HR vaginal ring Insert  vaginally and leave in place FOR 3 consecutive WEEKS, THEN REMOVE FOR ONE WEEK.] 1 each 0   No current facility-administered medications for this visit.    No family history on file.  ROS:  Pertinent items are noted in HPI.  Otherwise, a comprehensive ROS was negative.  Exam:   There were no vitals taken for this visit.   Ht Readings from Last 3 Encounters:  02/04/18 5' 3.25" (1.607 m)  02/03/17 5' 3.25" (1.607 m)  01/22/17 5' 2.75" (1.594 m)    General appearance: alert, cooperative and appears stated age Head: Normocephalic, without obvious abnormality, atraumatic Neck: no adenopathy, supple, symmetrical, trachea midline and thyroid normal to inspection and palpation Lungs: clear to auscultation bilaterally Breasts: normal appearance, no masses or tenderness, No nipple retraction or dimpling, No nipple discharge or bleeding, No axillary or supraclavicular adenopathy Heart: regular rate and rhythm Abdomen: soft, non-tender; no masses,  no organomegaly Extremities: extremities normal, atraumatic, no cyanosis or edema Skin: Skin color, texture, turgor normal. No rashes or lesions Lymph nodes: Cervical, supraclavicular, and axillary nodes normal. No abnormal inguinal nodes palpated Neurologic: Grossly normal   Pelvic: External genitalia:  no lesions, normal female              Urethra:  normal appearing urethra with no masses, tenderness or lesions              Bartholin's and Skene's: normal  Vagina: normal appearing vagina with normal color and discharge, no lesions              Cervix: no cervical motion tenderness, no lesions and nulliparous appearance, LEEP appearance              Pap taken: No. Bimanual Exam:  Uterus:  normal size, contour, position, consistency, mobility, non-tender and anteverted              Adnexa: normal adnexa and no mass, fullness, tenderness               Rectovaginal: Confirms               Anus:  normal sphincter tone, no  lesions  Chaperone present: yes  A:  Well Woman with normal exam  Contraception Nuvaring working well, desires continuance  STD screening  History of abnormal pap smear with LEEP   P:   Reviewed health and wellness pertinent to exam  Risks/benefits/warning signs with Nuvaring discussed, desires continuance  Rx Nuvaring see order with instructions  Lab: Affirm, GC/Chlamydia, HIV,RPR,Hep C  Pap smear: no   counseled on breast self exam, STD prevention, HIV risk factors and prevention, feminine hygiene, adequate intake of calcium and vitamin D, diet and exercise  return annually or prn  An After Visit Summary was printed and given to the patient.

## 2019-03-23 ENCOUNTER — Telehealth: Payer: Self-pay | Admitting: *Deleted

## 2019-03-23 LAB — RPR: RPR Ser Ql: NONREACTIVE

## 2019-03-23 LAB — VAGINITIS/VAGINOSIS, DNA PROBE
Candida Species: NEGATIVE
Gardnerella vaginalis: NEGATIVE
Trichomonas vaginosis: NEGATIVE

## 2019-03-23 LAB — GC/CHLAMYDIA PROBE AMP
Chlamydia trachomatis, NAA: NEGATIVE
Neisseria Gonorrhoeae by PCR: NEGATIVE

## 2019-03-23 LAB — HIV ANTIBODY (ROUTINE TESTING W REFLEX): HIV Screen 4th Generation wRfx: NONREACTIVE

## 2019-03-23 LAB — HEPATITIS C ANTIBODY: Hep C Virus Ab: >11 s/co ratio — ABNORMAL HIGH (ref 0.0–0.9)

## 2019-03-23 NOTE — Telephone Encounter (Signed)
Leda Min, RN  03/23/2019 1:20 PM EST    Spoke with patient, advised of results as seen below per Leota Sauers, CNM. Patient previously seen at [facility] [place] in 2017 for tx of Hep C. Advised patient I will f/u with the clinic and return call to advise on f/u. Patient verbalizes understanding and is agreeable.   See telephone encounter dated 03/23/19.      Call placed to [facility] in [facility] at [phone]. Left detailed message, advising patient was tx in 2017 for Hep C, current Hep C ab testing >11.0. Please return call to Noreene Larsson, RN at [facility] to advise on f/u.

## 2019-03-23 NOTE — Telephone Encounter (Signed)
-----   Message from Verner Chol, CNM sent at 03/23/2019 11:32 AM EST ----- Notify patient her vaginal screen for yeast, BV and trichomonas is negative. HIV, RPR is negative. Her Hep C is positive. She has had Hep C and was treated. She needs to follow up with previous Hep C clinic with her antibody level of 11 to see if she needs further evaluation.  We also should do the HCV Nucleic Acid Amplification if needed Gc/chlamydia pending

## 2019-03-24 ENCOUNTER — Other Ambulatory Visit: Payer: Self-pay | Admitting: Certified Nurse Midwife

## 2019-03-24 NOTE — Telephone Encounter (Signed)
[facility] is calling to verify allergies before filling nuvaring prescription. [facility] [phone] ref# [id number]

## 2019-03-24 NOTE — Telephone Encounter (Signed)
Spoke with Trey Paula, pharmacist at [facility]. Confirmed allergies on file. Questions answered.   Encounter closed.

## 2019-03-27 NOTE — Telephone Encounter (Signed)
Spoke with Meriam Sprague at [facility]. Confirmed message received. Message has been forwarded to Theophilus Kinds, RN for return call. She is out of the office today, will return on 1/12. Her direct contact is [phone]. Was advised [place] location is no longer open, if patient wants to see Dr. Jacqualine Mau she will need to come to [facility]. Will review with nurse when she returns call.

## 2019-03-30 ENCOUNTER — Telehealth: Payer: Self-pay | Admitting: *Deleted

## 2019-03-30 DIAGNOSIS — R768 Other specified abnormal immunological findings in serum: Secondary | ICD-10-CM

## 2019-03-30 NOTE — Telephone Encounter (Signed)
See telephone encounter dated 03/30/19. Encounter closed in error.

## 2019-03-30 NOTE — Telephone Encounter (Signed)
See previous closed encounter dated 03/23/19  Spoke with Misty Stanley at [facility] clinic. Was advised patient should have viral load drawn, HCV RNA Quant (resulkts in 2wks) or HCV RT PCR (results in 2 days). If not detected, no additional f/u needed. If detected, needs f/u for tx.   Leota Sauers, CNM -please review and advise on lab order.

## 2019-03-30 NOTE — Telephone Encounter (Signed)
Can [facility] do the HCV RT PCR, this would be preferable

## 2019-03-31 NOTE — Telephone Encounter (Signed)
Reviewed lab options with [facility]. Order pended for HCV RNA Quant.   Call returned to patient, advised of recommendations per [facility]. Patient agreeable to return for additional labs. Lab appt scheduled for 1/20 at 3:15pm. Advised patient [facility] office no longer located in [facility], if additional f/u is needed, will need to decide if she wants to return to [facility] located in [place] or locally. Advised patient our office will f/u with recommendations once labs have returned and are reviewed. Patient verbalizes understanding and is agreeable.   Routing to Leota Sauers, CNM to review pended lab order.

## 2019-03-31 NOTE — Telephone Encounter (Signed)
Agree with plan 

## 2019-03-31 NOTE — Telephone Encounter (Signed)
Order reviewed by Leota Sauers, CNM. Lab order signed off.   Routing to Leota Sauers, CNM for final review.

## 2019-04-03 ENCOUNTER — Other Ambulatory Visit: Payer: Self-pay | Admitting: Certified Nurse Midwife

## 2019-04-03 DIAGNOSIS — Z3049 Encounter for surveillance of other contraceptives: Secondary | ICD-10-CM

## 2019-04-04 ENCOUNTER — Other Ambulatory Visit: Payer: Self-pay | Admitting: *Deleted

## 2019-04-04 DIAGNOSIS — R768 Other specified abnormal immunological findings in serum: Secondary | ICD-10-CM

## 2019-04-04 NOTE — Telephone Encounter (Signed)
HCV RNA Quant order not previously placed as future clinic collect. New lab order placed. Previous order cancelled.   Routing to [employer], CNM FYI.

## 2019-04-04 NOTE — Addendum Note (Signed)
Addended by: Leda Min on: 04/04/2019 04:47 PM   Modules accepted: Orders

## 2019-04-05 ENCOUNTER — Telehealth: Payer: Self-pay | Admitting: Certified Nurse Midwife

## 2019-04-05 ENCOUNTER — Other Ambulatory Visit: Payer: Self-pay

## 2019-04-05 NOTE — Telephone Encounter (Signed)
Deborah Leonard, CNM -please review and advise.  

## 2019-04-05 NOTE — Telephone Encounter (Signed)
Patient called and cancelled her lab appointment for today for HCV RNA quant. Patient stated that she didn't feel she needed it.

## 2019-04-06 NOTE — Telephone Encounter (Signed)
Spoke with patient, advised per Leota Sauers, CNM. Patient request to contact her insurance provider to review coverage prior to scheduling. Lab and Dx code provided, patient will return call to advise on how she would like to proceed.

## 2019-04-06 NOTE — Telephone Encounter (Signed)
She should be aware this could mean she needs treatment again and if the results show she does not treatment a good follow up from previous management. I strongly recommend drawing this, but it is her decision.

## 2019-04-07 ENCOUNTER — Other Ambulatory Visit (INDEPENDENT_AMBULATORY_CARE_PROVIDER_SITE_OTHER): Payer: Managed Care, Other (non HMO)

## 2019-04-07 ENCOUNTER — Other Ambulatory Visit: Payer: Self-pay

## 2019-04-07 DIAGNOSIS — R768 Other specified abnormal immunological findings in serum: Secondary | ICD-10-CM

## 2019-04-09 LAB — HCV RNA QUANT: Hepatitis C Quantitation: NOT DETECTED IU/mL

## 2019-04-10 NOTE — Telephone Encounter (Signed)
Labs completed on 04/07/19, see result note.   Routing to [employer], CNM FYI.   Encounter closed.

## 2019-06-02 ENCOUNTER — Encounter: Payer: Self-pay | Admitting: Certified Nurse Midwife

## 2022-04-02 ENCOUNTER — Other Ambulatory Visit: Payer: Self-pay | Admitting: Student

## 2022-04-02 DIAGNOSIS — R42 Dizziness and giddiness: Secondary | ICD-10-CM

## 2022-04-07 ENCOUNTER — Ambulatory Visit: Payer: Managed Care, Other (non HMO)

## 2022-04-07 DIAGNOSIS — R202 Paresthesia of skin: Secondary | ICD-10-CM

## 2022-04-07 DIAGNOSIS — R519 Headache, unspecified: Secondary | ICD-10-CM

## 2022-04-07 DIAGNOSIS — R42 Dizziness and giddiness: Secondary | ICD-10-CM

## 2022-04-07 MED ORDER — GADOBUTROL 1 MMOL/ML IV SOLN
6.0000 mL | Freq: Once | INTRAVENOUS | Status: AC | PRN
  Administered 2022-04-07: 6 mL via INTRAVENOUS

## 2022-04-22 ENCOUNTER — Other Ambulatory Visit: Payer: Self-pay | Admitting: Neurology

## 2022-04-22 ENCOUNTER — Other Ambulatory Visit: Payer: Self-pay

## 2022-04-22 DIAGNOSIS — G930 Cerebral cysts: Secondary | ICD-10-CM

## 2022-07-02 ENCOUNTER — Other Ambulatory Visit: Payer: Self-pay | Admitting: Neurology

## 2022-07-02 DIAGNOSIS — R200 Anesthesia of skin: Secondary | ICD-10-CM

## 2022-07-07 ENCOUNTER — Ambulatory Visit: Payer: Managed Care, Other (non HMO)

## 2022-07-07 DIAGNOSIS — G930 Cerebral cysts: Secondary | ICD-10-CM

## 2022-07-07 DIAGNOSIS — R200 Anesthesia of skin: Secondary | ICD-10-CM

## 2022-07-07 MED ORDER — GADOBUTROL 1 MMOL/ML IV SOLN
6.0000 mL | Freq: Once | INTRAVENOUS | Status: AC | PRN
  Administered 2022-07-07: 6 mL via INTRAVENOUS
